# Patient Record
Sex: Female | Born: 1965 | Race: Black or African American | Hispanic: No | Marital: Married | Smoking: Never smoker
Health system: Southern US, Community
[De-identification: ages and names within clinical notes are randomized; demographics above are authoritative.]

## PROBLEM LIST (undated history)

## (undated) DIAGNOSIS — G35D Multiple sclerosis, unspecified: Secondary | ICD-10-CM

## (undated) DIAGNOSIS — G35 Multiple sclerosis: Secondary | ICD-10-CM

---

## 1998-04-15 ENCOUNTER — Other Ambulatory Visit: Admission: RE | Admit: 1998-04-15 | Discharge: 1998-04-15 | Payer: Self-pay | Admitting: Obstetrics and Gynecology

## 1999-05-20 ENCOUNTER — Other Ambulatory Visit: Admission: RE | Admit: 1999-05-20 | Discharge: 1999-05-20 | Payer: Self-pay | Admitting: Obstetrics and Gynecology

## 1999-11-10 ENCOUNTER — Encounter: Admission: RE | Admit: 1999-11-10 | Discharge: 2000-02-08 | Payer: Self-pay | Admitting: Obstetrics & Gynecology

## 1999-12-31 ENCOUNTER — Inpatient Hospital Stay (HOSPITAL_COMMUNITY): Admission: AD | Admit: 1999-12-31 | Discharge: 2000-01-02 | Payer: Self-pay | Admitting: Obstetrics and Gynecology

## 2000-01-03 ENCOUNTER — Encounter: Admission: RE | Admit: 2000-01-03 | Discharge: 2000-04-02 | Payer: Self-pay | Admitting: Obstetrics and Gynecology

## 2000-02-08 ENCOUNTER — Other Ambulatory Visit: Admission: RE | Admit: 2000-02-08 | Discharge: 2000-02-08 | Payer: Self-pay | Admitting: Obstetrics and Gynecology

## 2001-05-19 ENCOUNTER — Emergency Department (HOSPITAL_COMMUNITY): Admission: EM | Admit: 2001-05-19 | Discharge: 2001-05-19 | Payer: Self-pay | Admitting: Emergency Medicine

## 2001-05-19 ENCOUNTER — Encounter: Payer: Self-pay | Admitting: Emergency Medicine

## 2002-12-04 ENCOUNTER — Emergency Department (HOSPITAL_COMMUNITY): Admission: EM | Admit: 2002-12-04 | Discharge: 2002-12-04 | Payer: Self-pay | Admitting: Emergency Medicine

## 2002-12-04 ENCOUNTER — Encounter: Payer: Self-pay | Admitting: Emergency Medicine

## 2005-01-20 ENCOUNTER — Other Ambulatory Visit: Admission: RE | Admit: 2005-01-20 | Discharge: 2005-01-20 | Payer: Self-pay | Admitting: Internal Medicine

## 2005-02-10 ENCOUNTER — Encounter: Admission: RE | Admit: 2005-02-10 | Discharge: 2005-02-10 | Payer: Self-pay | Admitting: Internal Medicine

## 2006-09-26 ENCOUNTER — Inpatient Hospital Stay (HOSPITAL_COMMUNITY): Admission: AD | Admit: 2006-09-26 | Discharge: 2006-09-28 | Payer: Self-pay | Admitting: Obstetrics & Gynecology

## 2007-04-21 ENCOUNTER — Encounter (INDEPENDENT_AMBULATORY_CARE_PROVIDER_SITE_OTHER): Payer: Self-pay | Admitting: Specialist

## 2007-04-21 ENCOUNTER — Inpatient Hospital Stay (HOSPITAL_COMMUNITY): Admission: AD | Admit: 2007-04-21 | Discharge: 2007-04-24 | Payer: Self-pay | Admitting: Obstetrics & Gynecology

## 2009-07-14 ENCOUNTER — Ambulatory Visit: Payer: Self-pay | Admitting: Internal Medicine

## 2009-08-14 ENCOUNTER — Ambulatory Visit: Payer: Self-pay | Admitting: Internal Medicine

## 2009-08-14 ENCOUNTER — Encounter: Payer: Self-pay | Admitting: Family Medicine

## 2009-08-14 LAB — CONVERTED CEMR LAB
ALT: 13 units/L (ref 0–35)
AST: 14 units/L (ref 0–37)
Albumin: 4.2 g/dL (ref 3.5–5.2)
Alkaline Phosphatase: 62 units/L (ref 39–117)
BUN: 11 mg/dL (ref 6–23)
Basophils Absolute: 0 10*3/uL (ref 0.0–0.1)
Basophils Relative: 1 % (ref 0–1)
CO2: 25 meq/L (ref 19–32)
Calcium: 9.1 mg/dL (ref 8.4–10.5)
Chloride: 105 meq/L (ref 96–112)
Cholesterol: 177 mg/dL (ref 0–200)
Creatinine, Ser: 0.72 mg/dL (ref 0.40–1.20)
Eosinophils Absolute: 0.2 10*3/uL (ref 0.0–0.7)
Eosinophils Relative: 3 % (ref 0–5)
Glucose, Bld: 90 mg/dL (ref 70–99)
HCT: 36.4 % (ref 36.0–46.0)
HDL: 46 mg/dL (ref >39)
Hemoglobin: 11.4 g/dL — ABNORMAL LOW (ref 12.0–15.0)
LDL Cholesterol: 113 mg/dL — ABNORMAL HIGH (ref 0–99)
Lymphocytes Relative: 31 % (ref 12–46)
Lymphs Abs: 1.5 10*3/uL (ref 0.7–4.0)
MCHC: 31.3 g/dL (ref 30.0–36.0)
MCV: 90.8 fL (ref 78.0–100.0)
Monocytes Absolute: 0.5 10*3/uL (ref 0.1–1.0)
Monocytes Relative: 9 % (ref 3–12)
Neutro Abs: 2.8 10*3/uL (ref 1.7–7.7)
Neutrophils Relative %: 56 % (ref 43–77)
Platelets: 332 10*3/uL (ref 150–400)
Potassium: 4.1 meq/L (ref 3.5–5.3)
RBC: 4.01 M/uL (ref 3.87–5.11)
RDW: 13.6 % (ref 11.5–15.5)
Sodium: 141 meq/L (ref 135–145)
TSH: 1.244 microintl units/mL (ref 0.350–4.500)
Total Bilirubin: 0.4 mg/dL (ref 0.3–1.2)
Total CHOL/HDL Ratio: 3.8
Total Protein: 7 g/dL (ref 6.0–8.3)
Triglycerides: 91 mg/dL (ref <150)
VLDL: 18 mg/dL (ref 0–40)
WBC: 4.9 10*3/uL (ref 4.0–10.5)

## 2009-10-02 ENCOUNTER — Emergency Department (HOSPITAL_COMMUNITY): Admission: EM | Admit: 2009-10-02 | Discharge: 2009-10-02 | Payer: Self-pay | Admitting: Emergency Medicine

## 2009-10-08 ENCOUNTER — Ambulatory Visit: Payer: Self-pay | Admitting: Internal Medicine

## 2009-10-08 ENCOUNTER — Encounter: Payer: Self-pay | Admitting: Family Medicine

## 2009-10-08 LAB — CONVERTED CEMR LAB
Chlamydia, DNA Probe: NEGATIVE
GC Probe Amp, Genital: NEGATIVE

## 2009-10-15 ENCOUNTER — Ambulatory Visit (HOSPITAL_COMMUNITY): Admission: RE | Admit: 2009-10-15 | Discharge: 2009-10-15 | Payer: Self-pay | Admitting: Internal Medicine

## 2011-04-20 ENCOUNTER — Inpatient Hospital Stay (HOSPITAL_COMMUNITY)
Admission: EM | Admit: 2011-04-20 | Discharge: 2011-04-23 | DRG: 066 | Disposition: A | Discharge status: Home / Self Care | Payer: Medicaid Other | Attending: Internal Medicine | Admitting: Internal Medicine

## 2011-04-20 ENCOUNTER — Emergency Department (HOSPITAL_COMMUNITY): Payer: Medicaid Other

## 2011-04-20 DIAGNOSIS — E669 Obesity, unspecified: Secondary | ICD-10-CM | POA: Diagnosis present

## 2011-04-20 DIAGNOSIS — R5381 Other malaise: Secondary | ICD-10-CM | POA: Diagnosis present

## 2011-04-20 DIAGNOSIS — I6350 Cerebral infarction due to unspecified occlusion or stenosis of unspecified cerebral artery: Principal | ICD-10-CM | POA: Diagnosis present

## 2011-04-20 DIAGNOSIS — E7800 Pure hypercholesterolemia, unspecified: Secondary | ICD-10-CM | POA: Diagnosis present

## 2011-04-20 DIAGNOSIS — H538 Other visual disturbances: Secondary | ICD-10-CM | POA: Diagnosis present

## 2011-04-20 LAB — URINALYSIS, ROUTINE W REFLEX MICROSCOPIC
Bilirubin Urine: NEGATIVE
Glucose, UA: NEGATIVE mg/dL
Hgb urine dipstick: NEGATIVE
Ketones, ur: NEGATIVE mg/dL
Nitrite: NEGATIVE
Protein, ur: NEGATIVE mg/dL
Specific Gravity, Urine: 1.023 (ref 1.005–1.030)
Urobilinogen, UA: 1 mg/dL (ref 0.0–1.0)
pH: 6.5 (ref 5.0–8.0)

## 2011-04-20 LAB — LIPID PANEL
Cholesterol: 189 mg/dL (ref 0–200)
HDL: 45 mg/dL (ref >39)
LDL Cholesterol: 129 mg/dL — ABNORMAL HIGH (ref 0–99)
Total CHOL/HDL Ratio: 4.2 RATIO
Triglycerides: 74 mg/dL (ref <150)
VLDL: 15 mg/dL (ref 0–40)

## 2011-04-20 LAB — COMPREHENSIVE METABOLIC PANEL
ALT: 16 U/L (ref 0–35)
AST: 20 U/L (ref 0–37)
Albumin: 3.7 g/dL (ref 3.5–5.2)
Alkaline Phosphatase: 54 U/L (ref 39–117)
BUN: 9 mg/dL (ref 6–23)
CO2: 26 mEq/L (ref 19–32)
Calcium: 8.9 mg/dL (ref 8.4–10.5)
Chloride: 106 mEq/L (ref 96–112)
Creatinine, Ser: 0.7 mg/dL (ref 0.4–1.2)
GFR calc Af Amer: >60 mL/min (ref >60)
GFR calc non Af Amer: >60 mL/min (ref >60)
Glucose, Bld: 97 mg/dL (ref 70–99)
Potassium: 3.8 mEq/L (ref 3.5–5.1)
Sodium: 135 mEq/L (ref 135–145)
Total Bilirubin: 0.4 mg/dL (ref 0.3–1.2)
Total Protein: 6.9 g/dL (ref 6.0–8.3)

## 2011-04-20 LAB — DIFFERENTIAL
Basophils Absolute: 0 10*3/uL (ref 0.0–0.1)
Basophils Absolute: 0 10*3/uL (ref 0.0–0.1)
Basophils Relative: 0 % (ref 0–1)
Basophils Relative: 1 % (ref 0–1)
Eosinophils Absolute: 0.2 10*3/uL (ref 0.0–0.7)
Eosinophils Absolute: 0.2 10*3/uL (ref 0.0–0.7)
Eosinophils Relative: 3 % (ref 0–5)
Eosinophils Relative: 3 % (ref 0–5)
Lymphocytes Relative: 41 % (ref 12–46)
Lymphocytes Relative: 40 % (ref 12–46)
Lymphs Abs: 3 10*3/uL (ref 0.7–4.0)
Lymphs Abs: 2.7 10*3/uL (ref 0.7–4.0)
Monocytes Absolute: 0.5 10*3/uL (ref 0.1–1.0)
Monocytes Absolute: 0.5 10*3/uL (ref 0.1–1.0)
Monocytes Relative: 7 % (ref 3–12)
Monocytes Relative: 7 % (ref 3–12)
Neutro Abs: 3.6 10*3/uL (ref 1.7–7.7)
Neutro Abs: 3.5 10*3/uL (ref 1.7–7.7)
Neutrophils Relative %: 49 % (ref 43–77)
Neutrophils Relative %: 50 % (ref 43–77)

## 2011-04-20 LAB — BASIC METABOLIC PANEL
BUN: 9 mg/dL (ref 6–23)
CO2: 26 mEq/L (ref 19–32)
Calcium: 9.4 mg/dL (ref 8.4–10.5)
Chloride: 105 mEq/L (ref 96–112)
Creatinine, Ser: 0.69 mg/dL (ref 0.4–1.2)
GFR calc Af Amer: >60 mL/min (ref >60)
GFR calc non Af Amer: >60 mL/min (ref >60)
Glucose, Bld: 98 mg/dL (ref 70–99)
Potassium: 3.7 mEq/L (ref 3.5–5.1)
Sodium: 136 mEq/L (ref 135–145)

## 2011-04-20 LAB — CBC
HCT: 36.8 % (ref 36.0–46.0)
HCT: 36.4 % (ref 36.0–46.0)
Hemoglobin: 12.3 g/dL (ref 12.0–15.0)
Hemoglobin: 12.2 g/dL (ref 12.0–15.0)
MCH: 28.7 pg (ref 26.0–34.0)
MCH: 28.8 pg (ref 26.0–34.0)
MCHC: 33.4 g/dL (ref 30.0–36.0)
MCHC: 33.5 g/dL (ref 30.0–36.0)
MCV: 86 fL (ref 78.0–100.0)
MCV: 86.1 fL (ref 78.0–100.0)
Platelets: 338 10*3/uL (ref 150–400)
Platelets: 323 10*3/uL (ref 150–400)
RBC: 4.28 MIL/uL (ref 3.87–5.11)
RBC: 4.23 MIL/uL (ref 3.87–5.11)
RDW: 12.5 % (ref 11.5–15.5)
RDW: 12.5 % (ref 11.5–15.5)
WBC: 7.3 10*3/uL (ref 4.0–10.5)
WBC: 6.9 10*3/uL (ref 4.0–10.5)

## 2011-04-20 LAB — POCT CARDIAC MARKERS
CKMB, poc: <1 ng/mL — ABNORMAL LOW (ref 1.0–8.0)
Myoglobin, poc: 58.8 ng/mL (ref 12–200)
Troponin i, poc: <0.05 ng/mL (ref 0.00–0.09)

## 2011-04-20 LAB — POCT PREGNANCY, URINE: Preg Test, Ur: NEGATIVE

## 2011-04-20 LAB — PROTIME-INR
INR: 0.96 (ref 0.00–1.49)
Prothrombin Time: 13 seconds (ref 11.6–15.2)

## 2011-04-20 LAB — CK TOTAL AND CKMB (NOT AT ARMC)
CK, MB: 1.1 ng/mL (ref 0.3–4.0)
Relative Index: 0.6 (ref 0.0–2.5)
Total CK: 179 U/L — ABNORMAL HIGH (ref 7–177)

## 2011-04-20 LAB — TROPONIN I: Troponin I: 0.01 ng/mL (ref 0.00–0.06)

## 2011-04-20 LAB — APTT: aPTT: 31 seconds (ref 24–37)

## 2011-04-21 ENCOUNTER — Inpatient Hospital Stay (HOSPITAL_COMMUNITY): Payer: Medicaid Other

## 2011-04-21 DIAGNOSIS — I6789 Other cerebrovascular disease: Secondary | ICD-10-CM

## 2011-04-21 DIAGNOSIS — I6350 Cerebral infarction due to unspecified occlusion or stenosis of unspecified cerebral artery: Secondary | ICD-10-CM

## 2011-04-21 LAB — APTT: aPTT: 32 seconds (ref 24–37)

## 2011-04-21 LAB — GLUCOSE, CAPILLARY
Glucose-Capillary: 95 mg/dL (ref 70–99)
Glucose-Capillary: 95 mg/dL (ref 70–99)
Glucose-Capillary: 130 mg/dL — ABNORMAL HIGH (ref 70–99)
Glucose-Capillary: 135 mg/dL — ABNORMAL HIGH (ref 70–99)

## 2011-04-21 LAB — HOMOCYSTEINE: Homocysteine: 8.2 umol/L (ref 4.0–15.4)

## 2011-04-21 LAB — SEDIMENTATION RATE: Sed Rate: 26 mm/hr — ABNORMAL HIGH (ref 0–22)

## 2011-04-21 LAB — PROTIME-INR
INR: 0.98 (ref 0.00–1.49)
Prothrombin Time: 13.2 seconds (ref 11.6–15.2)

## 2011-04-21 LAB — LIPID PANEL
Cholesterol: 176 mg/dL (ref 0–200)
HDL: 44 mg/dL (ref >39)
LDL Cholesterol: 115 mg/dL — ABNORMAL HIGH (ref 0–99)
Total CHOL/HDL Ratio: 4 RATIO
Triglycerides: 87 mg/dL (ref <150)
VLDL: 17 mg/dL (ref 0–40)

## 2011-04-21 LAB — C-REACTIVE PROTEIN: CRP: 0.8 mg/dL — ABNORMAL HIGH (ref <0.6)

## 2011-04-21 LAB — VITAMIN B12: Vitamin B-12: 943 pg/mL — ABNORMAL HIGH (ref 211–911)

## 2011-04-21 LAB — HEMOGLOBIN A1C
Hgb A1c MFr Bld: 5.9 % — ABNORMAL HIGH (ref <5.7)
Mean Plasma Glucose: 123 mg/dL — ABNORMAL HIGH (ref <117)

## 2011-04-21 LAB — TSH: TSH: 0.684 u[IU]/mL (ref 0.350–4.500)

## 2011-04-22 ENCOUNTER — Inpatient Hospital Stay (HOSPITAL_COMMUNITY): Payer: Medicaid Other

## 2011-04-22 LAB — LUPUS ANTICOAGULANT PANEL
DRVVT: 40.9 secs (ref 36.2–44.3)
Lupus Anticoagulant: NOT DETECTED
PTT Lupus Anticoagulant: 39.9 secs (ref 30.0–45.6)

## 2011-04-22 LAB — CBC
HCT: 33.9 % — ABNORMAL LOW (ref 36.0–46.0)
Hemoglobin: 11.1 g/dL — ABNORMAL LOW (ref 12.0–15.0)
MCH: 28.4 pg (ref 26.0–34.0)
MCHC: 32.7 g/dL (ref 30.0–36.0)
MCV: 86.7 fL (ref 78.0–100.0)
Platelets: 297 10*3/uL (ref 150–400)
RBC: 3.91 MIL/uL (ref 3.87–5.11)
RDW: 12.6 % (ref 11.5–15.5)
WBC: 6.2 10*3/uL (ref 4.0–10.5)

## 2011-04-22 LAB — BASIC METABOLIC PANEL
BUN: 7 mg/dL (ref 6–23)
CO2: 27 mEq/L (ref 19–32)
Calcium: 8.7 mg/dL (ref 8.4–10.5)
Chloride: 105 mEq/L (ref 96–112)
Creatinine, Ser: 0.85 mg/dL (ref 0.4–1.2)
GFR calc Af Amer: >60 mL/min (ref >60)
GFR calc non Af Amer: >60 mL/min (ref >60)
Glucose, Bld: 95 mg/dL (ref 70–99)
Potassium: 3.7 mEq/L (ref 3.5–5.1)
Sodium: 139 mEq/L (ref 135–145)

## 2011-04-22 LAB — BETA-2-GLYCOPROTEIN I ABS, IGG/M/A
Beta-2 Glyco I IgG: 0 G Units (ref <20)
Beta-2-Glycoprotein I IgA: 8 A Units (ref <20)
Beta-2-Glycoprotein I IgM: 12 M Units (ref <20)

## 2011-04-22 LAB — GLUCOSE, CAPILLARY
Glucose-Capillary: 99 mg/dL (ref 70–99)
Glucose-Capillary: 99 mg/dL (ref 70–99)
Glucose-Capillary: 106 mg/dL — ABNORMAL HIGH (ref 70–99)
Glucose-Capillary: 133 mg/dL — ABNORMAL HIGH (ref 70–99)

## 2011-04-22 LAB — ACETYLCHOLINE RECEPTOR, BINDING: Acetylcholine Receptor Ab: <0.3 nmol/L (ref <=0.30)

## 2011-04-22 LAB — PROTEIN S ACTIVITY: Protein S Activity: 106 % (ref 69–129)

## 2011-04-22 LAB — CARDIOLIPIN ANTIBODIES, IGG, IGM, IGA
Anticardiolipin IgA: 15 APL U/mL (ref <22)
Anticardiolipin IgG: 5 GPL U/mL — ABNORMAL LOW (ref <23)
Anticardiolipin IgM: 3 MPL U/mL — ABNORMAL LOW (ref <11)

## 2011-04-22 LAB — PROTEIN C ACTIVITY: Protein C Activity: 114 % (ref 75–133)

## 2011-04-22 LAB — SICKLE CELL SCREEN: Sickle Cell Screen: NEGATIVE

## 2011-04-22 LAB — PROTEIN S, TOTAL: Protein S Ag, Total: 97 % (ref 60–150)

## 2011-04-22 LAB — ANTITHROMBIN III: AntiThromb III Func: 89 % (ref 76–126)

## 2011-04-22 LAB — ANA: Anti Nuclear Antibody(ANA): NEGATIVE

## 2011-04-22 LAB — PROTEIN C, TOTAL: Protein C, Total: 68 % — ABNORMAL LOW (ref 72–160)

## 2011-04-22 LAB — FOLATE RBC: RBC Folate: 785 ng/mL — ABNORMAL HIGH (ref >=366)

## 2011-04-22 MED ORDER — IOHEXOL 300 MG/ML  SOLN
50.0000 mL | Freq: Once | INTRAMUSCULAR | Status: AC | PRN
  Administered 2011-04-22: 50 mL via INTRAVENOUS

## 2011-04-23 LAB — GLUCOSE, CAPILLARY
Glucose-Capillary: 90 mg/dL (ref 70–99)
Glucose-Capillary: 129 mg/dL — ABNORMAL HIGH (ref 70–99)

## 2011-04-24 NOTE — Consult Note (Signed)
[name], [name] NO.:  [id number]  MEDICAL RECORD NO.:  [id number]           PATIENT TYPE:  I  LOCATION:  3733                         FACILITY:  [facility]  PHYSICIAN:  Thana Farr, MD    DATE OF BIRTH:  1966/07/09  DATE OF CONSULTATION:  04/21/2011 DATE OF DISCHARGE:                                CONSULTATION   Time is 11 a.m.  REASON FOR CONSULTATION:  Generalized weakness in the findings of a subacute left lacunar infarct on CT scan.  HISTORY OF PRESENT ILLNESS:  This is a 45 year old African American female with no past medical history other than childbirth x4.  The patient states that she woke on Wednesday morning approximately 5 days ago with generalized weakness.  As she got up to walk, she noted that she had difficulty with her balance and describes a general imbalance. She states that she then lifted to the left then to right, but just had to use a wide-based gait to get around.  She felt as though her lower extremities were slightly weak, but was able to ambulate without assistance.  The patient continued to have generalized weakness throughout on Thursday and Friday.  On Saturday, she noted some blurred vision more centrally.  This continued on Sunday and on Monday, the patient's family decided to bring her to the hospital for further evaluation.  On evaluation, CT of head showed a subacute infarct in the posterior left internal capsule.  At this time, the age was indeterminate.  For that reason, Neurology was consulted for further evaluation.  At the present time, the patient states that she continues to feel generalized weakness, continues to have blurred vision centrally, but no other localizing or lateralizing symptoms.  She denies any dysarthria, dysphasia.  She denies any numbness, tingling in lower or upper extremities.  Denies any dysmetria or difficulty with grip.  The patient denies any bowel or bladder dysfunction.  PAST  MEDICAL HISTORY:  Negative other than childbirth.  MEDICATIONS:  The patient takes no medications at this time.  ALLERGIES:  She is allergic to ERYTHROMYCIN.  FAMILY HISTORY:  Crohn's, hypertension, and possible sickle cell trait.  SOCIAL HISTORY:  The patient lives with her husband.  She is married. She has 4 children.  She does not drink, smoke, or do illicit drugs. She is a housewife.  REVIEW OF SYSTEMS:  Negative with the exception of above.  PHYSICAL EXAMINATION:  VITAL SIGNS:  Blood pressure is 115/68, pulse 77, respirations 16, temperature 98.5. NEUROLOGIC:  The patient is alert and oriented.  She carries out two and three-step commands without any difficulty.  Pupils are equal, round, and reactive to light and accommodating.  Conjugate gaze.  Extraocular movements are intact.  Visual fields are intact.  Double simultaneous stimuli; however, she does state that she has some difficulty with her central vision when looking across the room.  Right eye acutely exophthalmic.  Face appears symmetrical.  Tongue was midline.  Uvula is midline.  I did not note any dysarthria, aphasia, or slurred speech. Facial sensation was grossly intact.  Shoulder shrug and head  turn was within normal limits. COORDINATION:  The patient's coordination with finger-to-nose and heel- to-shin were smooth.  Fine motor movements were smooth, but slow. MOTOR:  The patient showed 5/5 strength throughout.  Tone and bulk were within normal limits.  No asterixis or clonus was noted.  Deep tendon reflexes were 2+ throughout with downgoing toes. DRIFT:  The patient had no drift in the upper or lower extremities. SENSATION:  The patient has full sensation in the upper and lower extremities to pinprick and light touch. PULMONARY:  Clear to auscultation. CARDIOVASCULAR:  S1 and S2 audible. NECK:  Negative for bruits and supple.  LABORATORY DATA:  Urinalysis was negative.  Urine pregnancy test negative.  Sodium  136, potassium 3.7, chloride 105, CO2 of 26, glucose 98, BUN 9, creatinine 0.69.  White blood cell count was 7.3, hemoglobin 12.3, hematocrit 36.8, platelets were 338, INR 0.96.  Cholesterol 189, triglycerides 74, HDL 45, LDL 129.  HbA1c is pending.  Hypercoagulable panel is pending.  IMAGING:  CT of head as above.  MRI is pending.  2-D echo is pending. Carotid Dopplers are pending.  ASSESSMENT/PLAN:  This is a 45 year old female with 5-day history of generalized weakness, 2-day history of blurring of central vision.  CT scan did show age-indeterminate left lacunar infarct; however, this appears to be old and most likely is not the reason for the patient's generalized bilateral weakness.  At this time, we would agree with obtaining MRI to further evaluate intracranial vessels and brain, however, in addition, we will also add TSH, B12, folate, acetylcholine receptor antibody, CRP, sed rate, ANA, and sickle cell screen for further evaluation of generalized weakness.  We will continue to follow this patient while she is in the hospital.  Dr. Thad Ranger has seen and evaluated the patient and agrees with the above-mentioned.  We will continue to follow this patient.     [name] Morn, PA-C   ______________________________ Thana Farr, MD    DS/MEDQ  D:  04/21/2011  T:  04/22/2011  Job:  [id number]  Electronically Signed by [name] Morn PA-C on 04/23/2011 12:36:00 PM Electronically Signed by Thana Farr MD on 04/24/2011 02:54:46 PM

## 2011-04-26 LAB — PROTHROMBIN GENE MUTATION

## 2011-04-26 LAB — FACTOR 5 LEIDEN

## 2011-05-03 ENCOUNTER — Ambulatory Visit: Payer: BC Managed Care – PPO | Admitting: Occupational Therapy

## 2011-05-03 ENCOUNTER — Ambulatory Visit: Payer: BC Managed Care – PPO | Attending: Internal Medicine | Admitting: *Deleted

## 2011-05-03 ENCOUNTER — Encounter: Payer: 59 | Admitting: Occupational Therapy

## 2011-05-03 DIAGNOSIS — I69919 Unspecified symptoms and signs involving cognitive functions following unspecified cerebrovascular disease: Secondary | ICD-10-CM | POA: Insufficient documentation

## 2011-05-03 DIAGNOSIS — M6281 Muscle weakness (generalized): Secondary | ICD-10-CM | POA: Insufficient documentation

## 2011-05-03 DIAGNOSIS — I69998 Other sequelae following unspecified cerebrovascular disease: Secondary | ICD-10-CM | POA: Insufficient documentation

## 2011-05-03 DIAGNOSIS — Z5189 Encounter for other specified aftercare: Secondary | ICD-10-CM | POA: Insufficient documentation

## 2011-05-03 DIAGNOSIS — R279 Unspecified lack of coordination: Secondary | ICD-10-CM | POA: Insufficient documentation

## 2011-05-03 DIAGNOSIS — R269 Unspecified abnormalities of gait and mobility: Secondary | ICD-10-CM | POA: Insufficient documentation

## 2011-05-09 NOTE — Discharge Summary (Signed)
[name], [name] NO.:  [id number]  MEDICAL RECORD NO.:  [id number]           PATIENT TYPE:  I  LOCATION:  3733                         FACILITY:  [facility]  PHYSICIAN:  Osvaldo Shipper, MD     DATE OF BIRTH:  March 09, 1966  DATE OF ADMISSION:  04/20/2011 DATE OF DISCHARGE:  04/23/2011                              DISCHARGE SUMMARY   The patient does not have a PCP yet but she is planning to seek care at [facility] from Apr 27, 2011, onwards when she gets her insurance.  CONSULTATION DURING THIS ADMISSION:  Thana Farr, MD, with Neurology.  IMAGING STUDIES DONE: 1. CT of the head which showed a suspicious finding in the left     internal capsule. 2. MRI of brain was done which showed a signal abnormality in the left     internal capsule, probably representing a subacute infarct.  A     followup MRI of with and without contrast was recommended in 8     weeks.  No mass effect was seen. 3. MRA of the head was done which was negative. 4. CT angio of the head was also done which showed a stable CT     appearance of the brain with hypodensity at the posterior limb of     internal capsule, corresponding to the lesion seen on the MRI. 5. CT angio of the neck was also done which was negative for any     carotid stenosis.  OTHER STUDIES DONE: 1. The carotid Doppler study which suggested a 60-79% left ICA     stenosis, however, please see CT angio results above. 2. Transcranial Dopplers was also normal. 3. Echocardiogram was done which showed normal EF of 60% with normal     wall motion.  No obvious shunt was noted.  PERTINENT LABORATORY DATA:  CBC was unremarkable.  ESR was mildly elevated at 26.  Coags were okay.  Lupus anticoagulant was not detected. Protein C and S functional levels were normal.  Electrolytes were normal.  HbA1c was 5.9.  Troponin was normal.  LDL was 115.  TSH was 0.68.  B12 was 943.  Folate was 785.  Urine pregnancy was negative.   C- reactive protein was 0.8.  Homocystine was 8.2.  UA was unremarkable. ANA was negative.  Glycoprotein levels were normal.  Cardiolipin antibody levels were normal.  Acetylcysteine antibody level was less than 0.3.  DISCHARGE DIAGNOSES: 1. Subacute stroke, left internal capsule, with right-sided weakness,     improved. 2. Generalized weakness, etiology unclear. 3. Hypercholesterolemia, requiring medications.  BRIEF HOSPITAL COURSE:  Briefly, this is a 45 year old African American female, who is obese, who presented to the hospital, complains of blurred vision, being off balance, and generalized weakness.  She was found to have the abnormalities discussed above and was thought to have a subacute stroke in the left internal capsule.  The patient was seen by Neurology.  She underwent workup as detailed above.  The patient has been seen by PT, OT on a daily basis and she has shown significant improvement in her functional level  so much so that she is able to go home with outpatient rehab.  She was put on aspirin and a statin.  She does not have any other medical problems, otherwise.  Heart-healthy diet recommended for her.  Hypercoagulable workup so far has been unremarkable.  Other tests are still pending, I believe.  Carotid Dopplers suggested left stenosis, however, CT angio did not show any stenosis.  The patient was also seen by [facility] and the patient is not thought to require inpatient rehab at this time.  Reason for the patient's generalized weakness is not clear. Acetylcysteine antibody levels are low, TSH was okay, so this may have something to do with her neurological presentation.  PHYSICAL EXAMINATION:  GENERAL:  On the day of discharge, the patient is feeling better, is able to ambulate more, does not require any assistive devices. VITAL SIGNS:  All stable.  Blood pressure is 114/64. LUNGS:  Clear to auscultation. CARDIOVASCULAR:  S1 and S2 normal,  regular.  No S3, S4, rubs, murmurs, or bruits. ABDOMEN:  Soft. NEUROLOGIC:  She still has subtle weakness in the right leg but much improved from admission.  Overall, the patient is stable for discharge.  DISCHARGE MEDICATIONS: 1. Aspirin 325 mg p.o. daily. 2. Simvastatin 40 mg p.o. daily.  FOLLOWUP: 1. The patient is to call [facility] to seek an appointment with     primary care doctor. 2. The patient has been given phone number for Dr. Pearlean Brownie to call for     appointment within a month to two months.  DIET:  Heart healthy.  PHYSICAL ACTIVITY:  As instructed by PT, OT and she will require outpatient PT, OT which will be arranged.  TOTAL TIME ON THIS DISCHARGE ENCOUNTER:  35 minutes.  Once seen by Neurology, the patient can be discharged home today.    Osvaldo Shipper, MD     GK/MEDQ  D:  04/23/2011  T:  04/23/2011  Job:  [id number]  cc:   Thana Farr, MD Pramod P. Pearlean Brownie, MD [facility]  Electronically Signed by Osvaldo Shipper MD on 05/09/2011 09:53:31 PM

## 2011-05-10 ENCOUNTER — Ambulatory Visit: Payer: BC Managed Care – PPO | Admitting: Physical Therapy

## 2011-05-10 ENCOUNTER — Ambulatory Visit: Payer: BC Managed Care – PPO | Admitting: Occupational Therapy

## 2011-05-12 ENCOUNTER — Ambulatory Visit: Payer: BC Managed Care – PPO | Admitting: Physical Therapy

## 2011-05-13 NOTE — H&P (Signed)
NAMECHERLY, ERNO.:  [id number]  MEDICAL RECORD NO.:  [id number]           PATIENT TYPE:  E  LOCATION:  [facility]                         FACILITY:  [facility]  PHYSICIAN:  Massie Maroon, MD        DATE OF BIRTH:  02/06/1966  DATE OF ADMISSION:  04/20/2011 DATE OF DISCHARGE:                             HISTORY & PHYSICAL   CHIEF COMPLAINT:  Blurred vision, off balance, and tired.  HISTORY OF PRESENT ILLNESS:  A 45 year old female with complaints of blurred vision, feeling off balance and tired starting about 6 days ago. The patient was brought into the ER today because of continued symptoms. The patient denies any headache, chest pain, palpitations, shortness of breath, nausea, vomiting, focal weakness, numbness, tingling..  The patient was brought into the ED for evaluation by her family and found to have a focus of hypoattenuation seen in the posterior limb of the internal capsule worrisome for infarct.  There was no hemorrhage or midline shift.  There is no evidence of hydrocephalus.  The patient had a set of cardiac markers that were negative.  EKG showed normal sinus rhythm at 75-80 without any ischemic changes.  The patient will be admitted for workup of stroke.  PAST MEDICAL HISTORY:  None.  PAST SURGICAL HISTORY:  Tubal ligation.  SOCIAL HISTORY:  The patient does not smoke or drink.  She has been married for 22 years.  She has 4 children.  She is a Futures trader.  FAMILY HISTORY:  She has a great aunt who had a stroke.  Her mother has Crohn disease.  She is not sure about her father's past medical history.  ALLERGIES:  ERYTHROMYCIN.  MEDICATIONS:  None.  REVIEW OF SYSTEMS:  Negative for all 10 organ systems except for pertinent positives stated above.  PHYSICAL EXAMINATION:  VITAL SIGNS:  Temperature 98.3, pulse 76, blood pressure 136/80, pulse ox is 99% on room air. HEENT:  Anicteric. NECK:  No JVD, no bruit. HEART:  Regular rate and rhythm.   S1-S2.  No murmurs, gallops or rubs. LUNGS:  Clear to auscultation bilaterally. ABDOMEN:  Soft, nontender, nondistended.  Positive bowel sounds. EXTREMITIES:  No cyanosis, clubbing or edema. SKIN:  No rashes. LYMPH NODES:  No adenopathy. NEURO:  Nonfocal, cranial nerves II-XII intact.  Reflexes 2+, symmetric, diffuse with downgoing toes bilaterally, motor strength 5/5 in all 4extremities, pinprick intact.  Good finger-to-nose and good heel-to- shin.  LABORATORY DATA:  PTT 31.  PT 13.0.  Troponin-I less than 0.05.  Sodium 136, potassium 3.7, BUN 9, creatinine 0.69.  WBC 7.3, hemoglobin 12.3, platelet count 338,000.  Urinalysis negative.  ASSESSMENT/PLAN: 1. Cerebrovascular accident, left posterior limb of the internal     capsule:  Check MRI and check carotid ultrasound.  Check cardiac 2-     D echocardiogram.  The patient will be placed on aspirin, Lipitor.     Consider Neurology consult in the a.m.  Check a hypercoagulable     panel since the patient is so young. 2. DVT prophylaxis, SCDs.     Massie Maroon, MD  JYK/MEDQ  D:  04/20/2011  T:  04/20/2011  Job:  [id number]  Electronically Signed by Pearson Grippe MD on 05/13/2011 06:52:19 AM

## 2011-05-14 NOTE — Discharge Summary (Signed)
NAMEKRYSLYN, HELBIG NO.:  [id number]   MEDICAL RECORD NO.:  [id number]          PATIENT TYPE:  INP   LOCATION:  9126                          FACILITY[facility]  [facility]   PHYSICIAN:  Zelphia Cairo, MD    DATE OF BIRTH:  1966/01/29   DATE OF ADMISSION:  04/21/2007  DATE OF DISCHARGE:  04/24/2007                               DISCHARGE SUMMARY   ADMITTING DIAGNOSES:  1. Intrauterine pregnancy at 15 weeks estimated gestational age.  2. Induction of labor secondary to amniotic fluid index of 7.7.   DISCHARGE DIAGNOSES:  1. Status post low transverse cesarean section secondary to transverse      lie.  2. Viable female infant.  3. Permanent sterilization.   PROCEDURES:  1. Primary low transverse cesarean section.  2. Modified Pomeroy bilateral tubal ligation.   REASON FOR ADMISSION:  Please see written H&P.   HOSPITAL COURSE:  The patient is 45 year old gravida 4, para 3, that was  admitted to [facility] at 38 weeks estimated  gestational age for induction of labor secondary to low AFI.  The  patient had had a history of spontaneous vaginal delivery x3.  On  admission, vital signs were stable.  She was afebrile.  Fetal heart  tones were reactive.  Contractions were noted to be somewhat infrequent.  Cervical exam revealed cervix 4 cm dilated, 50% effaced, vertex at a -2  station.  Artificial rupture of membranes was performed, which revealed  clear fluid.  Pitocin was started to augment the patient's labor.  Later  in the afternoon, the patient was noted to have stronger contractions.  Vaginal exam revealed cervix 4-5 cm, completely effaced, however unable  to feel vertex.  Ultrasound was performed which revealed that the fetus  was now in the transverse lie with back up.  Decision was made to  proceed with a cesarean delivery.  Due to multiparity, the patient had  also requested bilateral tubal ligation.  The patient was then  transferred to the  operating room, where spinal anesthesia was  administered without difficulty.  A low transverse incision was made,  with delivery of a viable female infant weighing 8 pounds 10 ounces,  Apgars of 9 at one minute and 9 at five minutes.  The patient tolerated  the procedure well and was taken to the recovery room in stable  condition.  On postoperative day #1, the patient was without complaint.  Vital signs were stable.  Abdomen soft.  Abdominal dressing was noted to  be clean, dry, and intact.  Laboratories were pending.  On the following  morning, the patient did complain of some gas pain.  Vital signs were  stable.  Abdomen slightly distended.  However, she did have good bowel  sounds.  Uterus was firm and nontender.  Incision was clean, dry, and  intact.  Laboratory findings revealed hemoglobin of 10.1.  On  postoperative day #3, the patient was feeling better.  She was without  complaint.  Vital signs were stable.  Abdomen was slightly distended,  with good bowel sounds.  Fundus firm  and nontender.  Incision was clean,  dry, and intact.  Staples were removed, and the patient was later  discharged home.   CONDITION ON DISCHARGE:  Stable.   DIET:  Regular as tolerated.   ACTIVITY:  No heavy lifting. No driving x2 weeks.  No vaginal entry.   FOLLOW UP:  Patient to follow up in the office in 1 week for incision  check.  She is to call for temperature greater than 100 degrees,  persistent nausea and vomiting, heavy vaginal bleeding, and/or redness  or drainage from the incisional site.   DISCHARGE MEDICATIONS:  1. Tylox, #30, 1 p.o. q.4-6 h. p.r.n.  2. Motrin 600 mg q.6 h.  3. Prenatal vitamins 1 p.o. daily.  4. Colace 1 p.o. daily p.r.n.      Julio Sicks, N.P.      Zelphia Cairo, MD  Electronically Signed    CC/MEDQ  D:  06/24/2007  T:  06/25/2007  Job:  [id number]

## 2011-05-14 NOTE — Discharge Summary (Signed)
NAMEBREANDA, [name] NO.:  [id number]   MEDICAL RECORD NO.:  [id number]          PATIENT TYPE:  INP   LOCATION:  9306                          FACILITY:  [facility]   PHYSICIAN:  M. Leda Quail, MD  DATE OF BIRTH:  12-27-1966   DATE OF ADMISSION:  09/26/2006  DATE OF DISCHARGE:  09/28/2006                                 DISCHARGE SUMMARY   ADMISSION DIAGNOSIS:  55. 45 year old G4, P3, African American female.  2. Right sided pain.  3. 6 cm ovarian cyst.   DISCHARGE DIAGNOSIS:  67. 45 year old G46, P3, African American female.  2. Right sided pain.  3. 6 cm ovarian cyst.   PROCEDURES:  Pain management and ultrasound.   HOSPITAL COURSE:  Ms. [name] is a 45 year old G4, P3, African American  female who presented to [facility] with pelvic pain.  This had been off and on all  day on October 2 and was constant since about 11 p.m.  She came in without  any vaginal bleeding or spotting, no vaginal discharge.  She did have a  positive pregnancy test with a quantitative HCG of 121,000.  She underwent  an exam that showed a tender right adnexa.  As a concern for ectopic was  present, she was sent for ultrasound which showed an 8 5/7 weeks IUP, fetal  heart rate 171, the pregnancy appeared to be developing normally.  The  patient did have two large right ovarian cysts measuring 5.8 and 5 cm.  There did appear to be good blood flow to the ovaries and renal ultrasound  was, otherwise, negative.  She was given IV and oral pain medication and did  have some pre-menstrual pain but ultimately the decision was made to keep  her overnight for monitoring and to do IV pain management.  The patient was  started on a PCA for pain management and the morning of October 2, the  patient was re-evaluated.  She was feeling much better.  She had actually  used four doses of pain medication.  Repeat ultrasound was done to insure  that there was no ovarian torsion and, again, there was good blood  flow to  the ovarian tissue.  The patient was able to tolerate a regular diet and on  the morning of hospital day two, was feeling better.  She had actually had  some nausea with the Dilaudid PCA which actually improved after stopping it.  Her physical exam was relatively benign and she had a soft abdomen which was  nontender, nondistended.  She never had an acute abdomen on exam.  She was  able to tolerate a regular diet and was voiding without difficulty.  As she  had much improved pain management, the decision was made to allow the  patient to go home.  The patient will be seen at the health department next  week, a contact was made for the patient and the coordinator of new patient  appointments called the patient to schedule an appointment.  The patient has  applied for Medicaid and is aware that once she  has a Medicaid card she is  welcome to transfer and she has been seen by our physicians in the past.  This is where she would like to go in the future and she knows as soon as  she has her Medicaid card she is welcome to transfer her care there.      Lum Keas, MD  Electronically Signed     MSM/MEDQ  D:  10/17/2006  T:  10/18/2006  Job:  [id number]

## 2011-05-14 NOTE — Op Note (Signed)
[name], [name] NO.:  [id number]   MEDICAL RECORD NO.:  [id number]          PATIENT TYPE:  INP   LOCATION:  9126                          FACILITY:  [facility]   PHYSICIAN:  Michelle L. Grewal, M.D.DATE OF BIRTH:  [birth year]   DATE OF PROCEDURE:  04/21/2007  DATE OF DISCHARGE:                               OPERATIVE REPORT   PREOPERATIVE DIAGNOSES:  1. Intrauterine pregnancy at term.  2. Oligohydramnios.  3. Transverse lie.  4. Desires permanent sterilization.   POSTOPERATIVE DIAGNOSES:  1. Intrauterine pregnancy at term.  2. Oligohydramnios.  3. Transverse lie.  4. Desires permanent sterilization.   PROCEDURE:  Primary low transverse cesarean section and modified Pomeroy  bilateral tubal ligation.   SURGEON:  Dr. Vincente Poli.   ANESTHESIA:  Spinal.   FINDINGS:  Female infant, T lie position, shoulder cord noted, Apgars 8 at  one minute and 9 at five minutes and weighing 8 pounds 10 ounces.   ESTIMATED BLOOD LOSS:  500 mL.   PROCEDURE:  Patient was taken to the operating room.  Spinal was placed,  and she was prepped and draped, and a Foley catheter was inserted.  It  was noted the baby was in T lie still by exam of her abdomen.  A low  transverse incision was made, carried down to the fascia, fascia scored  in midline and extended laterally.  Rectus muscles were separated in the  midline.  The peritoneum was entered bluntly.  The peritoneal incision  was then stretched.  The bladder blade was inserted.  The lower uterine  segment was identified, and the bladder flap was created sharply and  then digitally.  The bladder blade was readjusted.  A low transverse  incision was made in the uterus.  The baby was then delivered.  We were  able to bring the head down into the incision and was delivered cephalic  without any difficulty.  There was a lot of cord down near the shoulder,  and actually there was a loop of cord wrapped around the shoulder and  the right  arm.  Apgars were 8 at one minute and 9 at five minutes, and  he weighed 8 pounds 10 ounces.  The cord was clamped and cut.  The baby  was handed to the waiting pediatrician.  The uterus was exteriorized and  cleared of all clots and debris.  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  She may have had  an abruption at some point during her labor.  The uterine incision was  closed in one layer using 0 chromic in a running locked stitch.  Hemostasis was excellent.  At this point, I then identified each  fimbriated end and performed a modified Pomeroy bilateral tubal  ligation, grasping each midportion of each tying off a 3-cm knuckle on  each side x2 with plain gut suture x2 and excising out with Metzenbaum  scissors.  Each fallopian tube segment was sent separately for  pathology.  The uterus was carefully placed back  in the abdomen.  Irrigation was performed.  All pedicles were hemostatic.  Everything was  fine.  The peritoneum was reapproximated using 0 Vicryl.  The rectus  muscles were reapproximated using 0 Vicryl.  The fascia was closed in 0  Vicryl running stitch starting at each corner and meeting in the  midline.  After irrigation of the subcutaneous layer, the skin was  closed with staples.  All sponge, lap and instrument counts were correct  x2.  The patient went to the recovery room in stable condition.      Michelle L. Vincente Poli, M.D.  Electronically Signed     MLG/MEDQ  D:  04/21/2007  T:  04/21/2007  Job:  [id number]

## 2011-05-21 ENCOUNTER — Ambulatory Visit: Payer: BC Managed Care – PPO | Admitting: *Deleted

## 2011-05-21 ENCOUNTER — Ambulatory Visit: Payer: BC Managed Care – PPO | Admitting: Occupational Therapy

## 2011-05-25 ENCOUNTER — Ambulatory Visit: Payer: BC Managed Care – PPO | Admitting: Occupational Therapy

## 2011-05-25 ENCOUNTER — Ambulatory Visit: Payer: BC Managed Care – PPO | Admitting: *Deleted

## 2011-05-26 ENCOUNTER — Ambulatory Visit: Payer: BC Managed Care – PPO | Admitting: Occupational Therapy

## 2011-05-26 ENCOUNTER — Ambulatory Visit: Payer: BC Managed Care – PPO | Admitting: *Deleted

## 2011-05-31 ENCOUNTER — Ambulatory Visit: Payer: 59 | Admitting: *Deleted

## 2011-05-31 ENCOUNTER — Ambulatory Visit: Payer: BC Managed Care – PPO | Attending: Neurology | Admitting: Occupational Therapy

## 2011-05-31 DIAGNOSIS — R279 Unspecified lack of coordination: Secondary | ICD-10-CM | POA: Insufficient documentation

## 2011-05-31 DIAGNOSIS — Z5189 Encounter for other specified aftercare: Secondary | ICD-10-CM | POA: Insufficient documentation

## 2011-05-31 DIAGNOSIS — R269 Unspecified abnormalities of gait and mobility: Secondary | ICD-10-CM | POA: Insufficient documentation

## 2011-05-31 DIAGNOSIS — I69919 Unspecified symptoms and signs involving cognitive functions following unspecified cerebrovascular disease: Secondary | ICD-10-CM | POA: Insufficient documentation

## 2011-05-31 DIAGNOSIS — M6281 Muscle weakness (generalized): Secondary | ICD-10-CM | POA: Insufficient documentation

## 2011-05-31 DIAGNOSIS — I69998 Other sequelae following unspecified cerebrovascular disease: Secondary | ICD-10-CM | POA: Insufficient documentation

## 2011-06-02 ENCOUNTER — Ambulatory Visit: Payer: BC Managed Care – PPO | Admitting: Occupational Therapy

## 2011-06-03 ENCOUNTER — Ambulatory Visit: Payer: BC Managed Care – PPO | Admitting: *Deleted

## 2011-06-07 ENCOUNTER — Ambulatory Visit: Payer: BC Managed Care – PPO | Admitting: *Deleted

## 2011-06-07 ENCOUNTER — Encounter: Payer: 59 | Admitting: Occupational Therapy

## 2011-06-09 ENCOUNTER — Ambulatory Visit: Payer: BC Managed Care – PPO | Admitting: *Deleted

## 2011-06-09 ENCOUNTER — Ambulatory Visit: Payer: BC Managed Care – PPO | Admitting: Occupational Therapy

## 2011-06-14 ENCOUNTER — Encounter: Payer: 59 | Admitting: Occupational Therapy

## 2011-06-14 ENCOUNTER — Ambulatory Visit: Payer: 59 | Admitting: Physical Therapy

## 2011-06-16 ENCOUNTER — Other Ambulatory Visit (HOSPITAL_COMMUNITY)
Admission: RE | Admit: 2011-06-16 | Discharge: 2011-06-16 | Disposition: A | Discharge status: Home / Self Care | Payer: BC Managed Care – PPO | Source: Ambulatory Visit | Attending: Family Medicine | Admitting: Family Medicine

## 2011-06-16 ENCOUNTER — Other Ambulatory Visit: Payer: Self-pay

## 2011-06-16 ENCOUNTER — Ambulatory Visit: Payer: 59 | Admitting: Physical Therapy

## 2011-06-16 ENCOUNTER — Encounter: Payer: 59 | Admitting: Occupational Therapy

## 2011-06-16 DIAGNOSIS — Z01419 Encounter for gynecological examination (general) (routine) without abnormal findings: Secondary | ICD-10-CM | POA: Insufficient documentation

## 2011-06-21 ENCOUNTER — Encounter: Payer: 59 | Admitting: Occupational Therapy

## 2011-06-21 ENCOUNTER — Ambulatory Visit: Payer: 59 | Admitting: *Deleted

## 2011-06-23 ENCOUNTER — Ambulatory Visit: Payer: 59 | Admitting: *Deleted

## 2011-06-23 ENCOUNTER — Encounter: Payer: 59 | Admitting: Occupational Therapy

## 2012-01-04 ENCOUNTER — Other Ambulatory Visit: Payer: Self-pay | Admitting: Family Medicine

## 2012-01-04 DIAGNOSIS — N926 Irregular menstruation, unspecified: Secondary | ICD-10-CM

## 2012-01-05 ENCOUNTER — Other Ambulatory Visit: Payer: BC Managed Care – PPO

## 2012-01-14 ENCOUNTER — Other Ambulatory Visit: Payer: BC Managed Care – PPO

## 2012-01-25 ENCOUNTER — Other Ambulatory Visit: Payer: BC Managed Care – PPO

## 2012-01-26 ENCOUNTER — Ambulatory Visit
Admission: RE | Admit: 2012-01-26 | Discharge: 2012-01-26 | Disposition: A | Discharge status: Home / Self Care | Payer: No Typology Code available for payment source | Source: Ambulatory Visit | Attending: Family Medicine | Admitting: Family Medicine

## 2012-01-26 DIAGNOSIS — N926 Irregular menstruation, unspecified: Secondary | ICD-10-CM

## 2013-06-12 ENCOUNTER — Other Ambulatory Visit: Payer: Self-pay | Admitting: Neurology

## 2013-06-12 DIAGNOSIS — I6990 Unspecified sequelae of unspecified cerebrovascular disease: Secondary | ICD-10-CM

## 2013-06-12 DIAGNOSIS — R269 Unspecified abnormalities of gait and mobility: Secondary | ICD-10-CM

## 2013-06-19 ENCOUNTER — Ambulatory Visit (HOSPITAL_COMMUNITY)
Admission: RE | Admit: 2013-06-19 | Discharge: 2013-06-19 | Disposition: A | Discharge status: Home / Self Care | Payer: BC Managed Care – PPO | Source: Ambulatory Visit | Attending: Neurology | Admitting: Neurology

## 2013-06-19 ENCOUNTER — Other Ambulatory Visit: Payer: Self-pay | Admitting: Neurology

## 2013-06-19 DIAGNOSIS — G9389 Other specified disorders of brain: Secondary | ICD-10-CM | POA: Insufficient documentation

## 2013-06-19 DIAGNOSIS — I6990 Unspecified sequelae of unspecified cerebrovascular disease: Secondary | ICD-10-CM

## 2013-06-19 DIAGNOSIS — R269 Unspecified abnormalities of gait and mobility: Secondary | ICD-10-CM | POA: Insufficient documentation

## 2013-06-25 ENCOUNTER — Ambulatory Visit (HOSPITAL_COMMUNITY)
Admission: RE | Admit: 2013-06-25 | Discharge: 2013-06-25 | Disposition: A | Discharge status: Home / Self Care | Payer: BC Managed Care – PPO | Source: Ambulatory Visit | Attending: Neurology | Admitting: Neurology

## 2013-06-25 ENCOUNTER — Encounter (HOSPITAL_COMMUNITY): Payer: Self-pay

## 2013-06-25 MED ORDER — GADOBENATE DIMEGLUMINE 529 MG/ML IV SOLN
15.0000 mL | Freq: Once | INTRAVENOUS | Status: AC | PRN
  Administered 2013-06-25: 15 mL via INTRAVENOUS

## 2013-08-22 ENCOUNTER — Encounter (HOSPITAL_COMMUNITY)
Admission: RE | Admit: 2013-08-22 | Discharge: 2013-08-22 | Disposition: A | Discharge status: Home / Self Care | Payer: BC Managed Care – PPO | Source: Ambulatory Visit | Attending: Neurology | Admitting: Neurology

## 2013-08-22 DIAGNOSIS — Z79899 Other long term (current) drug therapy: Secondary | ICD-10-CM | POA: Insufficient documentation

## 2013-08-22 DIAGNOSIS — R269 Unspecified abnormalities of gait and mobility: Secondary | ICD-10-CM | POA: Insufficient documentation

## 2013-08-22 DIAGNOSIS — G35 Multiple sclerosis: Secondary | ICD-10-CM | POA: Insufficient documentation

## 2013-08-22 MED ORDER — METHYLPREDNISOLONE SODIUM SUCC 125 MG IJ SOLR: 1000.0000 mg | Freq: Once | INTRAMUSCULAR | Status: DC

## 2013-08-22 MED ORDER — SODIUM CHLORIDE 0.9 % IJ SOLN
10.0000 mL | INTRAMUSCULAR | Status: AC | PRN
  Administered 2013-08-22: 10 mL

## 2013-08-22 MED ORDER — SODIUM CHLORIDE 0.9 % IV SOLN
1000.0000 mg | Freq: Once | INTRAVENOUS | Status: AC
  Administered 2013-08-22: 1000 mg via INTRAVENOUS
  Filled 2013-08-22: qty 8

## 2013-08-23 ENCOUNTER — Encounter (HOSPITAL_COMMUNITY)
Admission: RE | Admit: 2013-08-23 | Discharge: 2013-08-23 | Disposition: A | Discharge status: Home / Self Care | Payer: BC Managed Care – PPO | Source: Ambulatory Visit | Attending: Neurology | Admitting: Neurology

## 2013-08-23 MED ORDER — SODIUM CHLORIDE 0.9 % IJ SOLN: 10.0000 mL | INTRAMUSCULAR | Status: DC | PRN

## 2013-08-23 MED ORDER — SODIUM CHLORIDE 0.9 % IV SOLN
1000.0000 mg | Freq: Every day | INTRAVENOUS | Status: DC
  Administered 2013-08-23: 1000 mg via INTRAVENOUS
  Filled 2013-08-23: qty 8

## 2013-08-23 MED ORDER — SODIUM CHLORIDE 0.9 % IJ SOLN
10.0000 mL | INTRAMUSCULAR | Status: AC | PRN
  Administered 2013-08-23: 10 mL

## 2013-08-24 ENCOUNTER — Encounter (HOSPITAL_COMMUNITY)
Admission: RE | Admit: 2013-08-24 | Discharge: 2013-08-24 | Disposition: A | Discharge status: Home / Self Care | Payer: BC Managed Care – PPO | Source: Ambulatory Visit | Attending: Neurology | Admitting: Neurology

## 2013-08-24 MED ORDER — SODIUM CHLORIDE 0.9 % IJ SOLN: 10.0000 mL | INTRAMUSCULAR | Status: DC | PRN

## 2013-08-24 MED ORDER — SODIUM CHLORIDE 0.9 % IJ SOLN
10.0000 mL | INTRAMUSCULAR | Status: AC | PRN
  Administered 2013-08-24: 10 mL

## 2013-08-24 MED ORDER — SODIUM CHLORIDE 0.9 % IV SOLN
1000.0000 mg | Freq: Once | INTRAVENOUS | Status: AC
  Administered 2013-08-24: 1000 mg via INTRAVENOUS
  Filled 2013-08-24: qty 8

## 2015-04-16 ENCOUNTER — Other Ambulatory Visit: Payer: Self-pay | Admitting: Neurology

## 2015-04-16 DIAGNOSIS — G35 Multiple sclerosis: Secondary | ICD-10-CM

## 2015-04-25 ENCOUNTER — Ambulatory Visit (HOSPITAL_COMMUNITY)
Admission: RE | Admit: 2015-04-25 | Discharge: 2015-04-25 | Disposition: A | Discharge status: Home / Self Care | Payer: BLUE CROSS/BLUE SHIELD | Source: Ambulatory Visit | Attending: Neurology | Admitting: Neurology

## 2015-04-25 DIAGNOSIS — G35 Multiple sclerosis: Secondary | ICD-10-CM | POA: Diagnosis not present

## 2015-04-25 MED ORDER — GADOBENATE DIMEGLUMINE 529 MG/ML IV SOLN
18.0000 mL | Freq: Once | INTRAVENOUS | Status: AC | PRN
  Administered 2015-04-25: 18 mL via INTRAVENOUS

## 2016-01-30 ENCOUNTER — Other Ambulatory Visit: Payer: Self-pay

## 2016-01-30 DIAGNOSIS — Z1231 Encounter for screening mammogram for malignant neoplasm of breast: Secondary | ICD-10-CM

## 2016-02-11 ENCOUNTER — Ambulatory Visit
Admission: RE | Admit: 2016-02-11 | Discharge: 2016-02-11 | Disposition: A | Discharge status: Home / Self Care | Payer: BLUE CROSS/BLUE SHIELD | Source: Ambulatory Visit

## 2016-02-11 DIAGNOSIS — Z1231 Encounter for screening mammogram for malignant neoplasm of breast: Secondary | ICD-10-CM

## 2017-02-02 ENCOUNTER — Encounter (HOSPITAL_COMMUNITY)
Admission: RE | Admit: 2017-02-02 | Discharge: 2017-02-02 | Disposition: A | Discharge status: Home / Self Care | Payer: BLUE CROSS/BLUE SHIELD | Source: Ambulatory Visit | Attending: Neurology | Admitting: Neurology

## 2017-02-02 ENCOUNTER — Encounter (HOSPITAL_COMMUNITY): Payer: Self-pay

## 2017-02-02 DIAGNOSIS — G35 Multiple sclerosis: Secondary | ICD-10-CM | POA: Diagnosis present

## 2017-02-02 MED ORDER — SODIUM CHLORIDE 0.9 % IV SOLN
1000.0000 mg | Freq: Once | INTRAVENOUS | Status: AC
  Administered 2017-02-02: 1000 mg via INTRAVENOUS
  Filled 2017-02-02: qty 8

## 2017-02-02 MED ORDER — SODIUM CHLORIDE 0.9 % IV SOLN
INTRAVENOUS | Status: DC
  Administered 2017-02-02: 09:00:00 via INTRAVENOUS

## 2017-02-03 ENCOUNTER — Encounter (HOSPITAL_COMMUNITY)
Admission: RE | Admit: 2017-02-03 | Discharge: 2017-02-03 | Disposition: A | Discharge status: Home / Self Care | Payer: BLUE CROSS/BLUE SHIELD | Source: Ambulatory Visit | Attending: Neurology | Admitting: Neurology

## 2017-02-03 DIAGNOSIS — G35 Multiple sclerosis: Secondary | ICD-10-CM | POA: Diagnosis not present

## 2017-02-03 MED ORDER — SODIUM CHLORIDE 0.9 % IV SOLN
Freq: Once | INTRAVENOUS | Status: AC
  Administered 2017-02-03: 10:00:00 via INTRAVENOUS

## 2017-02-03 MED ORDER — METHYLPREDNISOLONE SODIUM SUCC 1000 MG IJ SOLR
1000.0000 mg | Freq: Once | INTRAMUSCULAR | Status: AC
  Administered 2017-02-03: 1000 mg via INTRAVENOUS
  Filled 2017-02-03: qty 8

## 2017-02-04 ENCOUNTER — Encounter (HOSPITAL_COMMUNITY)
Admission: RE | Admit: 2017-02-04 | Discharge: 2017-02-04 | Disposition: A | Discharge status: Home / Self Care | Payer: BLUE CROSS/BLUE SHIELD | Source: Ambulatory Visit | Attending: Neurology | Admitting: Neurology

## 2017-02-04 DIAGNOSIS — G35 Multiple sclerosis: Secondary | ICD-10-CM | POA: Diagnosis not present

## 2017-02-04 MED ORDER — SODIUM CHLORIDE 0.9 % IV SOLN
1000.0000 mg | Freq: Once | INTRAVENOUS | Status: AC
  Administered 2017-02-04: 1000 mg via INTRAVENOUS
  Filled 2017-02-04: qty 8

## 2017-02-04 MED ORDER — SODIUM CHLORIDE 0.9 % IV SOLN
INTRAVENOUS | Status: DC
  Administered 2017-02-04: 250 mL via INTRAVENOUS

## 2017-02-28 ENCOUNTER — Emergency Department (HOSPITAL_COMMUNITY)
Admission: EM | Admit: 2017-02-28 | Discharge: 2017-02-28 | Disposition: A | Discharge status: Home / Self Care | Payer: BLUE CROSS/BLUE SHIELD | Attending: Emergency Medicine | Admitting: Emergency Medicine

## 2017-02-28 ENCOUNTER — Encounter (HOSPITAL_COMMUNITY): Payer: Self-pay | Admitting: Nurse Practitioner

## 2017-02-28 DIAGNOSIS — G35 Multiple sclerosis: Secondary | ICD-10-CM | POA: Diagnosis not present

## 2017-02-28 DIAGNOSIS — R200 Anesthesia of skin: Secondary | ICD-10-CM | POA: Diagnosis present

## 2017-02-28 DIAGNOSIS — R202 Paresthesia of skin: Secondary | ICD-10-CM | POA: Insufficient documentation

## 2017-02-28 HISTORY — DX: Multiple sclerosis, unspecified: G35.D

## 2017-02-28 HISTORY — DX: Multiple sclerosis: G35

## 2017-02-28 LAB — URINALYSIS, ROUTINE W REFLEX MICROSCOPIC
Bilirubin Urine: NEGATIVE
Glucose, UA: NEGATIVE mg/dL
Hgb urine dipstick: NEGATIVE
Ketones, ur: NEGATIVE mg/dL
Leukocytes, UA: NEGATIVE
Nitrite: NEGATIVE
Protein, ur: NEGATIVE mg/dL
Specific Gravity, Urine: 1.024 (ref 1.005–1.030)
pH: 5 (ref 5.0–8.0)

## 2017-02-28 LAB — CBC
HCT: 35.3 % — ABNORMAL LOW (ref 36.0–46.0)
Hemoglobin: 11.6 g/dL — ABNORMAL LOW (ref 12.0–15.0)
MCH: 28.4 pg (ref 26.0–34.0)
MCHC: 32.9 g/dL (ref 30.0–36.0)
MCV: 86.5 fL (ref 78.0–100.0)
Platelets: 381 10*3/uL (ref 150–400)
RBC: 4.08 MIL/uL (ref 3.87–5.11)
RDW: 13.3 % (ref 11.5–15.5)
WBC: 5.9 10*3/uL (ref 4.0–10.5)

## 2017-02-28 LAB — BASIC METABOLIC PANEL
Anion gap: 7 (ref 5–15)
BUN: 9 mg/dL (ref 6–20)
CO2: 28 mmol/L (ref 22–32)
Calcium: 9.3 mg/dL (ref 8.9–10.3)
Chloride: 105 mmol/L (ref 101–111)
Creatinine, Ser: 0.88 mg/dL (ref 0.44–1.00)
GFR calc Af Amer: >60 mL/min (ref >60)
GFR calc non Af Amer: >60 mL/min (ref >60)
Glucose, Bld: 108 mg/dL — ABNORMAL HIGH (ref 65–99)
Potassium: 3.8 mmol/L (ref 3.5–5.1)
Sodium: 140 mmol/L (ref 135–145)

## 2017-02-28 NOTE — ED Provider Notes (Signed)
[facility] Provider Note   CSN: [id number] Arrival date & time: 02/28/17  1530     History   Chief Complaint Chief Complaint  Patient presents with  . Numbness    HPI [name] is a 51 y.o. female with past medical history of multiple sclerosis diagnosed in 2014 presents to the emergency department reporting generalized numbness and tingling that first started in bilateral hands and now is across her entire body (posterior head, arms, bilateral legs).  Symptoms started ~2 weeks ago. Patient states that she stopped taking Tecfidera for MS 2-3 months ago because she ran out of her prescription.  She contacted her neurologist (Dr. Beryle Beams) who advised her she needed new lab work up before she could get a refill.  Reportedly patient did lab work but her neurologist has not refilled her prescriptions and he is now out of town and cannot see her in clinic until next week.  Patient states she went to [facility] ED for same symptoms on 02/02/17, she was evaluated and she received three shots of solumedrol for her symptoms (on 2/7, 2/8, 2/9) which improved her symptoms.  Symptoms started to worsen again 10 days ago.  Patient requests referral to new neurologist in the area.  Patient denies fevers, changes in vision, difficulty breathing/SOB, n/v/d/c or heat exposure.     HPI  Past Medical History:  Diagnosis Date  . Multiple sclerosis (HCC)     There are no active problems to display for this patient.   Past Surgical History:  Procedure Laterality Date  . CESAREAN SECTION      OB History    No data available       Home Medications    Prior to Admission medications   Medication Sig Start Date End Date Taking? Authorizing Provider  ibuprofen (ADVIL,MOTRIN) 200 MG tablet Take 400 mg by mouth every 6 (six) hours as needed for headache.   Yes Historical Provider, MD  Dimethyl Fumarate (TECFIDERA) 240 MG CPDR Take 240 mg by mouth 2 (two) times daily.    Historical  Provider, MD  Vitamin D, Ergocalciferol, (DRISDOL) 50000 units CAPS capsule Take 50,000 Units by mouth once a week. 02/25/17   Historical Provider, MD    Family History History reviewed. No pertinent family history.  Social History Social History  Substance Use Topics  . Smoking status: Never Smoker  . Smokeless tobacco: Never Used  . Alcohol use No     Allergies   Erythromycin   Review of Systems Review of Systems  Constitutional: Negative for appetite change, chills and fever.  HENT: Negative for congestion and sore throat.   Eyes: Negative for photophobia, pain and visual disturbance.  Respiratory: Negative for cough, chest tightness and shortness of breath.   Cardiovascular: Negative for chest pain, palpitations and leg swelling.  Gastrointestinal: Negative for abdominal pain, constipation, diarrhea, nausea and vomiting.  Genitourinary: Negative for difficulty urinating, dysuria and hematuria.  Musculoskeletal: Positive for gait problem. Negative for arthralgias.  Skin: Negative for rash.  Allergic/Immunologic: Positive for immunocompromised state (multiple sclerosis).  Neurological: Positive for numbness. Negative for dizziness, seizures, syncope, speech difficulty, weakness, light-headedness and headaches.  Hematological: Does not bruise/bleed easily.  Psychiatric/Behavioral: Negative.      Physical Exam Updated Vital Signs BP 111/71 (BP Location: Right Arm)   Pulse 84   Temp 98.8 F (37.1 C) (Oral)   Resp 19   SpO2 99%   Physical Exam  Constitutional: She is oriented to person, place, and  time. She appears well-developed and well-nourished.  HENT:  Head: Normocephalic and atraumatic.  Nose: Nose normal.  Mouth/Throat: Oropharynx is clear and moist. No oropharyngeal exudate.  Eyes: Conjunctivae and EOM are normal. Pupils are equal, round, and reactive to light.  Neck: Normal range of motion. Neck supple.  Cardiovascular: Normal rate, regular rhythm, normal  heart sounds and intact distal pulses.   No murmur heard. Pulmonary/Chest: Effort normal and breath sounds normal. No respiratory distress. She has no wheezes. She has no rales. She exhibits no tenderness.  Abdominal: Soft. Bowel sounds are normal. She exhibits no distension. There is no tenderness.  Musculoskeletal: Normal range of motion. She exhibits no deformity.  Lymphadenopathy:    She has no cervical adenopathy.  Neurological: She is alert and oriented to person, place, and time. No sensory deficit.  Pt is alert and oriented.   Speech and phonation normal.   Thought process coherent.   Strength 5/5 in upper and lower extremities.   Sensation to light touch intact in upper and lower extremities.  Gait normal.   Negative Romberg. No leg drift.  Intact finger to nose test. CN I not tested CN II full visual fields  CN III, IV, VI PEERL and EOM intact CN V light touch intact in all 3 divisions of trigeminal nerve CN VII facial nerve movements intact, symmetric CN VIII hearing intact to finger rub CN IX, X no uvula deviation, symmetric soft palate rise CN XI 5/5 SCM and trapezius strength  CN XII Tongue midline with symmetric L/R movement  Skin: Skin is warm and dry. Capillary refill takes less than 2 seconds.  Psychiatric: She has a normal mood and affect. Her behavior is normal. Judgment and thought content normal.  Nursing note and vitals reviewed.    ED Treatments / Results  Labs (all labs ordered are listed, but only abnormal results are displayed) Labs Reviewed  BASIC METABOLIC PANEL - Abnormal; Notable for the following:       Result Value   Glucose, Bld 108 (*)    All other components within normal limits  CBC - Abnormal; Notable for the following:    Hemoglobin 11.6 (*)    HCT 35.3 (*)    All other components within normal limits  URINALYSIS, ROUTINE W REFLEX MICROSCOPIC    EKG  EKG Interpretation  Date/Time:  Monday February 28 2017 15:48:44 EST Ventricular  Rate:  83 PR Interval:  160 QRS Duration: 68 QT Interval:  360 QTC Calculation: 423 R Axis:   25 Text Interpretation:  Normal sinus rhythm Normal ECG No significant change since last tracing Confirmed by LITTLE MD, RACHEL ([id number]) on 02/28/2017 7:42:57 PM       Radiology No results found.  Procedures Procedures (including critical care time)  Medications Ordered in ED Medications - No data to display   Initial Impression / Assessment and Plan / ED Course  I have reviewed the triage vital signs and the nursing notes.  Pertinent labs & imaging results that were available during my care of the patient were reviewed by me and considered in my medical decision making (see chart for details).  Clinical Course as of Feb 28 2257  Mon Feb 28, 2017  2231 Nitrite: NEGATIVE [CG]  2231 Temp: 98.8 F (37.1 C) [CG]  2231 Pulse Rate: 84 [CG]  2231 BP: 111/71 [CG]  2231 Resp: 19 [CG]  2231 SpO2: 99 % [CG]  2231 Sodium: 140 [CG]  2231 Potassium: 3.8 [CG]  2231 Chloride: 105 [  CG]  2231 CO2: 28 [CG]  2231 Glucose: (!) 108 [CG]  2231 WBC: 5.9 [CG]  2231 Normal sinus rhythm Normal ECG ED EKG [CG]    Clinical Course User Index [CG] Liberty Handy, PA-C   51 year old female with multiple sclerosis diagnosed in 2014 (followed by Dr. Beryle Beams) presents with generalized numbness and tingling x 1 month. Patient stopped taking her Tecfidera 2-3 months ago, has contacted her neurologist who has done blood work but has not refilled her prescription.  Patient requesting referral to new neurologist.  Patient went to [facility] ED for same on 2/7-2/9 and she was given Solu-Medrol injections 3 which helped her numbness and tingling. Patient states that her numbness and tingling improved with the steroids but have since returned and worsened in the last 10 days. No fever, shortness of breath, chest pain, changes in vision. Vital signs reassuring without fever. Complete neuro exam normal. No  electrolyte abnormalities or signs of infection and urinalysis.  I do not think further emergent lab work or imaging is indicated at this time. Patient will follow benefit from outpatient neurology follow-up to restart her Tecfidera.  Might consider repeat MRI to look for new lesions or progression.  Ambulatory referral to neurology place to be seen in clinic in 1 week.  Discussed plan with patient and relative who are in agreement with outpatient follow up.  Strict ED return precautions patient is aware of red flag symptoms to monitor for that would warrant return to ED for more work up (chest pain, shortness of breath/difficulty breathing, weakness, changes in vision).  Patient, ED treatment and discharge plan was discussed with supervising physician who is agreeable with plan.     Final Clinical Impressions(s) / ED Diagnoses   Final diagnoses:  Paresthesias  Multiple sclerosis [facility])    New Prescriptions New Prescriptions   No medications on file     Jerrell Mylar 02/28/17 2258    Laurence Spates, MD 03/01/17 [phone]

## 2017-02-28 NOTE — ED Triage Notes (Signed)
Pt presents with c/o numbness and tingling. She has a history of numbness and tingling to her hands since 2014 which she relates to her diagnosis of MS. Since 10 days ago, the numbness and tingling has moved into her entire body. She denies any pain now, but she has had some pain in her back and sides when lying down over the past several days. She also noticed more headaches than normal this week and she's felt more difficulty walking. She has tried stretching with no improvement. She has seen neurologist for this and was told to "wait it out" but she feels the symptoms are getting worse.

## 2017-02-28 NOTE — Discharge Instructions (Signed)
The blood work we did in the emergency department is overall normal. Your EKG and urinalysis were also normal. Please follow-up with neurologist to be seen as soon as possible for an evaluation.   We have placed an urgent referral to neurology and we have asked them to see you in clinic in 1 week.    Return to the emergency department if you develop weakness, shortness of breath or difficulty breathing, blurred vision or changes in your vision

## 2017-03-08 ENCOUNTER — Other Ambulatory Visit: Payer: Self-pay | Admitting: Neurology

## 2017-03-08 DIAGNOSIS — G35 Multiple sclerosis: Secondary | ICD-10-CM

## 2017-03-11 ENCOUNTER — Other Ambulatory Visit (HOSPITAL_COMMUNITY): Payer: Self-pay | Admitting: *Deleted

## 2017-03-14 ENCOUNTER — Encounter (HOSPITAL_COMMUNITY)
Admission: RE | Admit: 2017-03-14 | Discharge: 2017-03-14 | Disposition: A | Discharge status: Home / Self Care | Payer: BLUE CROSS/BLUE SHIELD | Source: Ambulatory Visit | Attending: Neurology | Admitting: Neurology

## 2017-03-14 DIAGNOSIS — G35 Multiple sclerosis: Secondary | ICD-10-CM | POA: Diagnosis not present

## 2017-03-14 MED ORDER — METHYLPREDNISOLONE SODIUM SUCC 1000 MG IJ SOLR
1000.0000 mg | Freq: Every day | INTRAMUSCULAR | Status: DC
  Administered 2017-03-14: 1000 mg via INTRAVENOUS
  Filled 2017-03-14: qty 8

## 2017-03-15 ENCOUNTER — Encounter (HOSPITAL_COMMUNITY)
Admission: RE | Admit: 2017-03-15 | Discharge: 2017-03-15 | Disposition: A | Discharge status: Home / Self Care | Payer: BLUE CROSS/BLUE SHIELD | Source: Ambulatory Visit | Attending: Neurology | Admitting: Neurology

## 2017-03-15 DIAGNOSIS — G35 Multiple sclerosis: Secondary | ICD-10-CM | POA: Insufficient documentation

## 2017-03-15 MED ORDER — METHYLPREDNISOLONE SODIUM SUCC 1000 MG IJ SOLR
1000.0000 mg | Freq: Every day | INTRAMUSCULAR | Status: DC
  Administered 2017-03-15: 1000 mg via INTRAVENOUS
  Filled 2017-03-15: qty 8

## 2017-03-16 ENCOUNTER — Encounter (HOSPITAL_COMMUNITY)
Admission: RE | Admit: 2017-03-16 | Discharge: 2017-03-16 | Disposition: A | Discharge status: Home / Self Care | Payer: BLUE CROSS/BLUE SHIELD | Source: Ambulatory Visit | Attending: Neurology | Admitting: Neurology

## 2017-03-16 DIAGNOSIS — G35 Multiple sclerosis: Secondary | ICD-10-CM | POA: Insufficient documentation

## 2017-03-16 MED ORDER — METHYLPREDNISOLONE SODIUM SUCC 1000 MG IJ SOLR
1000.0000 mg | Freq: Every day | INTRAMUSCULAR | Status: DC
  Administered 2017-03-16: 1000 mg via INTRAVENOUS
  Filled 2017-03-16: qty 8

## 2017-03-17 ENCOUNTER — Ambulatory Visit (HOSPITAL_COMMUNITY): Payer: BLUE CROSS/BLUE SHIELD

## 2017-03-17 ENCOUNTER — Encounter (HOSPITAL_COMMUNITY)
Admission: RE | Admit: 2017-03-17 | Discharge: 2017-03-17 | Disposition: A | Discharge status: Home / Self Care | Payer: BLUE CROSS/BLUE SHIELD | Source: Ambulatory Visit | Attending: Neurology | Admitting: Neurology

## 2017-03-17 ENCOUNTER — Ambulatory Visit (HOSPITAL_COMMUNITY)
Admission: RE | Admit: 2017-03-17 | Discharge: 2017-03-17 | Disposition: A | Discharge status: Home / Self Care | Payer: BLUE CROSS/BLUE SHIELD | Source: Ambulatory Visit | Attending: Neurology | Admitting: Neurology

## 2017-03-17 DIAGNOSIS — G35 Multiple sclerosis: Secondary | ICD-10-CM | POA: Insufficient documentation

## 2017-03-17 MED ORDER — GADOBENATE DIMEGLUMINE 529 MG/ML IV SOLN: 20.0000 mL | Freq: Once | INTRAVENOUS | Status: DC | PRN

## 2017-03-17 MED ORDER — SODIUM CHLORIDE 0.9 % IV SOLN
1000.0000 mg | Freq: Every day | INTRAVENOUS | Status: DC
  Administered 2017-03-17: 1000 mg via INTRAVENOUS
  Filled 2017-03-17: qty 8

## 2017-03-17 MED ORDER — GADOBENATE DIMEGLUMINE 529 MG/ML IV SOLN
20.0000 mL | Freq: Once | INTRAVENOUS | Status: AC | PRN
  Administered 2017-03-17: 20 mL via INTRAVENOUS

## 2017-03-17 NOTE — Progress Notes (Signed)
Pt has decided to keep her iv in overnight so that she can use it for her last infusion tomorow

## 2017-03-18 ENCOUNTER — Encounter (HOSPITAL_COMMUNITY)
Admission: RE | Admit: 2017-03-18 | Discharge: 2017-03-18 | Disposition: A | Discharge status: Home / Self Care | Payer: BLUE CROSS/BLUE SHIELD | Source: Ambulatory Visit | Attending: Neurology | Admitting: Neurology

## 2017-03-18 DIAGNOSIS — G35 Multiple sclerosis: Secondary | ICD-10-CM | POA: Diagnosis not present

## 2017-03-18 MED ORDER — SODIUM CHLORIDE 0.9 % IV SOLN
1000.0000 mg | Freq: Every day | INTRAVENOUS | Status: DC
  Administered 2017-03-18: 1000 mg via INTRAVENOUS
  Filled 2017-03-18: qty 8

## 2017-04-20 ENCOUNTER — Encounter: Payer: Self-pay | Admitting: Neurology

## 2017-04-20 ENCOUNTER — Ambulatory Visit (INDEPENDENT_AMBULATORY_CARE_PROVIDER_SITE_OTHER): Payer: BLUE CROSS/BLUE SHIELD | Admitting: Neurology

## 2017-04-20 DIAGNOSIS — G35 Multiple sclerosis: Secondary | ICD-10-CM | POA: Diagnosis not present

## 2017-04-20 NOTE — Progress Notes (Signed)
Reason for visit: Multiple sclerosis  Referring physician: Dr. Yolanda Manges is a 51 y.o. female  History of present illness:  Ms. Burnsworth is a 51 year old left handed black female with a history of multiple sclerosis, previously followed through Dr. Truddie Coco. The patient had been on Tecfidera, but she ran out of the medication in January 2018. A refill for the Tecfidera was not given as the patient did not return for blood work evaluation. Within a month she developed onset of paresthesias in both upper extremities, she was treated with a 3 day course of Solu-Medrol beginning on 02/02/2017. This helped the symptoms temporarily, but she returned to the emergency room on 02/28/2017 with a 10 day history of worsening symptoms. The patient received a 5 day course of Solu-Medrol beginning on 03/14/2017. MRI of the brain and cervical spine revealed evidence of an acute C3-4 spinal cord enhancing lesion consistent with active multiple sclerosis. The patient has requested to be followed through another neurology office, and she comes to this office today. The original diagnosis of multiple sclerosis was made in 2014. Her first symptoms were noted in 2012 associated with a visual disturbance involving the right eye and some gait instability. The patient initially was felt to have had a stroke event. The patient was seen by Dr. Truddie Coco in 2014, lumbar puncture and MRI were done at that time, and she was started on Tecfidera. The patient had done quite well on this medication, but she decided to stop the drug in January 2018 as she was not having any symptoms and she was starting to question the diagnosis of multiple sclerosis. Currently, after steroid treatment the numbness that occurred on the arms and the body and legs has improved. The patient has had some residual numbness in the hands, nerve conduction studies done by Dr. Truddie Coco recently showed evidence of bilateral carpal tunnel syndrome according to  the patient. The patient is wearing wrist splints for this. The patient is still noting some problems with tingling in the arms and legs and body with neck flexion. The patient denies any weakness of the extremities or any significant change in balance. The patient denies any issues controlling the bowels or the bladder. The patient has not had any change in vision. She has been having some troubles with memory over the last year, she has been more forgetful recently. She is back on [place], she lives in [place] and seeks a neurologist that is more local.  Past Medical History:  Diagnosis Date  . Multiple sclerosis (HCC)     Past Surgical History:  Procedure Laterality Date  . CESAREAN SECTION      Family History  Problem Relation Age of Onset  . Hypertension Mother   . Post-traumatic stress disorder Father   . Hypertension Father   . Stroke Father   . Hypertension Brother   . Hypertension Brother     Social history:  reports that she has never smoked. She has never used smokeless tobacco. She reports that she does not drink alcohol or use drugs.  Medications:  Prior to Admission medications   Medication Sig Start Date End Date Taking? Authorizing Provider  Dimethyl Fumarate (TECFIDERA) 240 MG CPDR Take 240 mg by mouth 2 (two) times daily.    Historical Provider, MD  ibuprofen (ADVIL,MOTRIN) 200 MG tablet Take 400 mg by mouth every 6 (six) hours as needed for headache.    Historical Provider, MD  Vitamin D, Ergocalciferol, (DRISDOL) 50000 units CAPS  capsule Take 50,000 Units by mouth once a week. 02/25/17   Historical Provider, MD      Allergies  Allergen Reactions  . Erythromycin Shortness Of Breath    Chest pains also    ROS:  Out of a complete 14 system review of symptoms, the patient complains only of the following symptoms, and all other reviewed systems are negative.  Weight gain Blood in stool Achy muscles Memory loss, numbness  Blood pressure 119/76, pulse  90, height 5\' 4"  (1.626 m), weight 213 lb 8 oz (96.8 kg).  Physical Exam  General: The patient is alert and cooperative at the time of the examination. The patient is markedly obese.  Eyes: Pupils are equal, round, and reactive to light. Discs are flat bilaterally.  Neck: The neck is supple, no carotid bruits are noted.  Respiratory: The respiratory examination is clear.  Cardiovascular: The cardiovascular examination reveals a regular rate and rhythm, no obvious murmurs or rubs are noted.  Skin: Extremities are without significant edema.  Neurologic Exam  Mental status: The patient is alert and oriented x 3 at the time of the examination. The patient has apparent normal recent and remote memory, with an apparently normal attention span and concentration ability.  Cranial nerves: Facial symmetry is present. There is good sensation of the face to pinprick and soft touch bilaterally. The strength of the facial muscles and the muscles to head turning and shoulder shrug are normal bilaterally. Speech is well enunciated, no aphasia or dysarthria is noted. Extraocular movements are full. Visual fields are full. The tongue is midline, and the patient has symmetric elevation of the soft palate. No obvious hearing deficits are noted.  Motor: The motor testing reveals 5 over 5 strength of all 4 extremities. Good symmetric motor tone is noted throughout.  Sensory: Sensory testing is intact to pinprick, soft touch, vibration sensation, and position sense on all 4 extremities. No evidence of extinction is noted.  Coordination: Cerebellar testing reveals good finger-nose-finger and heel-to-shin bilaterally.  Gait and station: Gait is normal. Tandem gait is slightly unsteady. Romberg is negative. No drift is seen.  Reflexes: Deep tendon reflexes are symmetric and normal bilaterally. Toes are downgoing bilaterally.   MRI brain and cervical 03/18/17:  IMPRESSION: 1. 2 cm active demyelinating lesion  within the cervical spinal cord, within the posterior central aspect at the C3-C4 levels. 2. A single new supratentorial white matter lesion without any brain parenchymal contrast enhancement to indicate active cerebral lesions. 3. Otherwise unchanged distribution of white matter lesions in a pattern compatible with multiple sclerosis.  * MRI scan images were reviewed online. I agree with the written report.    Assessment/Plan:  1. Multiple sclerosis  2. Bilateral carpal tunnel syndrome  3. 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 She will follow-up in 4 months, we will check blood work at that time. The patient will contact our office if any new issues arise.  Marlan Palau MD 04/20/2017 9:11 AM  [facility] [address] [place], [place] [address]  Phone [phone] Fax [phone]

## 2017-07-09 ENCOUNTER — Encounter (HOSPITAL_COMMUNITY): Payer: Self-pay | Admitting: Emergency Medicine

## 2017-07-09 ENCOUNTER — Emergency Department (HOSPITAL_COMMUNITY)
Admission: EM | Admit: 2017-07-09 | Discharge: 2017-07-09 | Disposition: A | Discharge status: Home / Self Care | Payer: BLUE CROSS/BLUE SHIELD | Attending: Emergency Medicine | Admitting: Emergency Medicine

## 2017-07-09 DIAGNOSIS — Z79899 Other long term (current) drug therapy: Secondary | ICD-10-CM | POA: Diagnosis not present

## 2017-07-09 DIAGNOSIS — M545 Low back pain, unspecified: Secondary | ICD-10-CM

## 2017-07-09 MED ORDER — HYDROMORPHONE HCL 1 MG/ML IJ SOLN
1.0000 mg | Freq: Once | INTRAMUSCULAR | Status: AC
  Administered 2017-07-09: 1 mg via INTRAVENOUS
  Filled 2017-07-09: qty 1

## 2017-07-09 MED ORDER — ONDANSETRON HCL 4 MG/2ML IJ SOLN
4.0000 mg | Freq: Once | INTRAMUSCULAR | Status: AC
  Administered 2017-07-09: 4 mg via INTRAVENOUS
  Filled 2017-07-09: qty 2

## 2017-07-09 MED ORDER — OXYCODONE-ACETAMINOPHEN 5-325 MG PO TABS
ORAL_TABLET | ORAL | Status: DC
Start: 2017-07-09 — End: 2017-07-09
  Filled 2017-07-09: qty 1

## 2017-07-09 MED ORDER — HYDROCODONE-ACETAMINOPHEN 5-325 MG PO TABS: 1.0000 | ORAL_TABLET | Freq: Four times a day (QID) | ORAL | 0 refills | Status: DC | PRN

## 2017-07-09 MED ORDER — SODIUM CHLORIDE 0.9 % IV SOLN
INTRAVENOUS | Status: DC
Start: 2017-07-09 — End: 2017-07-09
  Administered 2017-07-09: 11:00:00 via INTRAVENOUS

## 2017-07-09 MED ORDER — OXYCODONE-ACETAMINOPHEN 5-325 MG PO TABS
1.0000 | ORAL_TABLET | ORAL | Status: DC | PRN
Start: 2017-07-09 — End: 2017-07-09
  Administered 2017-07-09: 1 via ORAL

## 2017-07-09 NOTE — ED Triage Notes (Signed)
Pt states "my back is in a lot of pain since Thursday. Pain gradually got worse since Thursday." Movement makes the pain worse. Pain starts in the middle of the back and goes down spine. Pt denies injury.

## 2017-07-09 NOTE — ED Provider Notes (Signed)
[facility] Provider Note   CSN: [id number] Arrival date & time: 07/09/17  7741     History   Chief Complaint Chief Complaint  Patient presents with  . Back Pain    HPI [name] is a 51 y.o. female.  Patient comes in for right-sided low back pain. Been present since Thursday. No injury. Pain is worse with movement. Patient was seen in urgent care yesterday treated with the tramadol muscle relaxer without any significant improvement. Pain got worse overnight. Patient has a history of MS however no exacerbations currently. Patient has no numbness or weakness to the right leg or left leg. No focal deficits. No abdominal pain. And no dysuria. No history of similar back problem. Back pain is 10 out of 10.      Past Medical History:  Diagnosis Date  . Multiple sclerosis [facility])     Patient Active Problem List   Diagnosis Date Noted  . Multiple sclerosis (HCC) 04/20/2017    Past Surgical History:  Procedure Laterality Date  . CESAREAN SECTION      OB History    No data available       Home Medications    Prior to Admission medications   Medication Sig Start Date End Date Taking? Authorizing Provider  cyclobenzaprine (FLEXERIL) 5 MG tablet Take 5 mg by mouth 3 (three) times daily as needed for muscle spasms.   Yes [provider]  Dimethyl Fumarate (TECFIDERA) 240 MG CPDR Take 240 mg by mouth 2 (two) times daily.   Yes [provider]  gabapentin (NEURONTIN) 300 MG capsule Take 300 mg by mouth 2 (two) times daily. 04/05/17  Yes [provider]  ibuprofen (ADVIL,MOTRIN) 200 MG tablet Take 400 mg by mouth every 6 (six) hours as needed for headache.   Yes [provider]  naproxen (NAPROSYN) 500 MG tablet Take 500 mg by mouth 2 (two) times daily with a meal.   Yes [provider]  Polyethyl Glycol-Propyl Glycol (SYSTANE OP) Place 1 drop into both eyes daily as needed (dry eyes).   Yes [provider]  Vitamin  D, Ergocalciferol, (DRISDOL) 50000 units CAPS capsule Take 50,000 Units by mouth once a week. 02/25/17  Yes [provider]  HYDROcodone-acetaminophen (NORCO/VICODIN) 5-325 MG tablet Take 1-2 tablets by mouth every 6 (six) hours as needed for moderate pain. 07/09/17   Vanetta Mulders, MD    Family History Family History  Problem Relation Age of Onset  . Hypertension Mother   . Post-traumatic stress disorder Father   . Hypertension Father   . Stroke Father   . Hypertension Brother   . Hypertension Brother     Social History Social History  Substance Use Topics  . Smoking status: Never Smoker  . Smokeless tobacco: Never Used  . Alcohol use No     Allergies   Erythromycin   Review of Systems Review of Systems  Constitutional: Negative for fever.  HENT: Negative for congestion.   Eyes: Negative for visual disturbance.  Respiratory: Negative for shortness of breath.   Cardiovascular: Negative for chest pain.  Gastrointestinal: Negative for abdominal pain.  Genitourinary: Negative for dysuria.  Musculoskeletal: Positive for back pain.  Skin: Negative for rash.  Neurological: Negative for weakness, numbness and headaches.  Hematological: Does not bruise/bleed easily.  Psychiatric/Behavioral: Negative for confusion.     Physical Exam Updated Vital Signs BP 114/84   Pulse 62   Temp 97.6 F (36.4 C) (Oral)   Resp 18  Ht 1.626 m (5\' 4" )   Wt 93 kg (205 lb)   LMP 07/02/2017   SpO2 97%   BMI 35.19 kg/m   Physical Exam  Constitutional: She is oriented to person, place, and time. She appears well-developed and well-nourished. No distress.  HENT:  Head: Normocephalic and atraumatic.  Mouth/Throat: Oropharynx is clear and moist.  Eyes: Pupils are equal, round, and reactive to light. Conjunctivae and EOM are normal.  Neck: Normal range of motion. Neck supple.  Cardiovascular: Normal rate, regular rhythm and normal heart sounds.   Pulmonary/Chest: Effort normal  and breath sounds normal. No respiratory distress.  Abdominal: Soft. Bowel sounds are normal.  Musculoskeletal: Normal range of motion. She exhibits tenderness.  Tenderness to palpation right mid lumbar area. No posterior spine tenderness.  Neurological: She is alert and oriented to person, place, and time. No cranial nerve deficit or sensory deficit. She exhibits normal muscle tone. Coordination normal.  Nursing note and vitals reviewed.    ED Treatments / Results  Labs (all labs ordered are listed, but only abnormal results are displayed) Labs Reviewed - No data to display  EKG  EKG Interpretation None       Radiology No results found.  Procedures Procedures (including critical care time)  Medications Ordered in ED Medications  ondansetron (ZOFRAN) injection 4 mg (4 mg Intravenous Given 07/09/17 1034)  HYDROmorphone (DILAUDID) injection 1 mg (1 mg Intravenous Given 07/09/17 1035)  HYDROmorphone (DILAUDID) injection 1 mg (1 mg Intravenous Given 07/09/17 1247)     Initial Impression / Assessment and Plan / ED Course  I have reviewed the triage vital signs and the nursing notes.  Pertinent labs & imaging results that were available during my care of the patient were reviewed by me and considered in my medical decision making (see chart for details).    Patient with a history of MS. Patient with lumbar strain on Thursday. [facility] urgent care yesterday was put on medications for it but not helping. No distal focal deficits. No direct injury to the back. No trauma to the back. Patient improved here with pain medication.   Final Clinical Impressions(s) / ED Diagnoses   Final diagnoses:  Acute right-sided low back pain without sciatica    New Prescriptions Discharge Medication List as of 07/09/2017 12:41 PM    START taking these medications   Details  HYDROcodone-acetaminophen (NORCO/VICODIN) 5-325 MG tablet Take 1-2 tablets by mouth every 6 (six) hours as needed for  moderate pain., Starting Sat 07/09/2017, Print         Vanetta Mulders, MD 07/09/17 [phone]

## 2017-07-09 NOTE — Discharge Instructions (Signed)
Follow-up with your primary. Continue other medications but stop the tramadol. Take the hydrocodone as directed. Return for any new or worse symptoms.

## 2017-08-10 ENCOUNTER — Ambulatory Visit (INDEPENDENT_AMBULATORY_CARE_PROVIDER_SITE_OTHER): Payer: BLUE CROSS/BLUE SHIELD | Admitting: Neurology

## 2017-08-10 ENCOUNTER — Encounter: Payer: Self-pay | Admitting: Neurology

## 2017-08-10 VITALS — BP 129/79 | HR 81 | Ht 64.0 in | Wt 201.0 lb

## 2017-08-10 DIAGNOSIS — G4489 Other headache syndrome: Secondary | ICD-10-CM | POA: Diagnosis not present

## 2017-08-10 DIAGNOSIS — Z5181 Encounter for therapeutic drug level monitoring: Secondary | ICD-10-CM | POA: Diagnosis not present

## 2017-08-10 DIAGNOSIS — G35 Multiple sclerosis: Secondary | ICD-10-CM

## 2017-08-10 NOTE — Patient Instructions (Signed)
   We will taper off of the gabapentin, go to one at night for 2 weeks, then stop. If you desire a medication for the headache, please call.

## 2017-08-10 NOTE — Progress Notes (Signed)
Reason for visit: Multiple sclerosis  [name] is an 51 y.o. female  History of present illness:  [name] is a 51 year old left-handed black female with a history of multiple sclerosis. The patient has been on Tecfidera, she had a significant spinal cord exacerbation when she went off the drug in early 2018 with a C3 spinal cord lesion. The patient has a history of bilateral carpal tunnel syndrome, she does have some residual numbness in the hands that could be either from carpal tunnel or from the spinal cord lesion. She occasionally will have some numbness in the feet. She has tolerated the Tecfidera well. Over the last 2-1/2 weeks she has developed daily bifrontal headaches that are better in the morning and worse as the day goes on, the headaches are low-grade and do not prevent her from functioning. She denies any photophobia, phonophobia, or nausea or vomiting. The patient denies a neck stiffness or neck discomfort. The patient was having about 1 or 2 headaches a week prior to the onset of the daily headaches. The patient does report some mild gait instability, she denies issues controlling the bowels or the bladder. She denies any new visual complaints, or numbness or weakness. She returns to the office for an evaluation.  Past Medical History:  Diagnosis Date  . Multiple sclerosis (HCC)     Past Surgical History:  Procedure Laterality Date  . CESAREAN SECTION      Family History  Problem Relation Age of Onset  . Hypertension Mother   . Post-traumatic stress disorder Father   . Hypertension Father   . Stroke Father   . Hypertension Brother   . Hypertension Brother     Social history:  reports that she has never smoked. She has never used smokeless tobacco. She reports that she does not drink alcohol or use drugs.    Allergies  Allergen Reactions  . Erythromycin Shortness Of Breath    Chest pains also    Medications:  Prior to Admission medications     Medication Sig Start Date End Date Taking? Authorizing Provider  Dimethyl Fumarate (TECFIDERA) 240 MG CPDR Take 240 mg by mouth 2 (two) times daily.   Yes [provider]  gabapentin (NEURONTIN) 300 MG capsule Take 300 mg by mouth 2 (two) times daily. 04/05/17  Yes [provider]  ibuprofen (ADVIL,MOTRIN) 200 MG tablet Take 400 mg by mouth every 6 (six) hours as needed for headache.   Yes [provider]    ROS:  Out of a complete 14 system review of symptoms, the patient complains only of the following symptoms, and all other reviewed systems are negative.  Headache  Blood pressure 129/79, pulse 81, height 5\' 4"  (1.626 m), weight 201 lb (91.2 kg).  Physical Exam  General: The patient is alert and cooperative at the time of the examination.  Skin: No significant peripheral edema is noted.   Neurologic Exam  Mental status: The patient is alert and oriented x 3 at the time of the examination. The patient has apparent normal recent and remote memory, with an apparently normal attention span and concentration ability.   Cranial nerves: Facial symmetry is present. Speech is normal, no aphasia or dysarthria is noted. Extraocular movements are full. Visual fields are full. Pupils are equal, round, and reactive to light. Discs are flat bilaterally.  Motor: The patient has good strength in all 4 extremities.  Sensory examination: Soft touch sensation is symmetric on the face, arms, and  legs.  Coordination: The patient has good finger-nose-finger and heel-to-shin bilaterally.  Gait and station: The patient has a normal gait. Tandem gait is minimally unsteady. Romberg is negative. No drift is seen.  Reflexes: Deep tendon reflexes are symmetric.   MRI brain and cervical 03/17/17:  IMPRESSION: 1. 2 cm active demyelinating lesion within the cervical spinal cord, within the posterior central aspect at the C3-C4 levels. 2. A single new supratentorial white  matter lesion without any brain parenchymal contrast enhancement to indicate active cerebral lesions. 3. Otherwise unchanged distribution of white matter lesions in a pattern compatible with multiple sclerosis.  * MRI scan images were reviewed online. I agree with the written report.    Assessment/Plan:  1. Multiple sclerosis  2. Daily headache  3. Bilateral carpal tunnel syndrome  The patient is doing well with her multiple sclerosis and she will remain on Tecfidera. We will check blood work today. She comes in today with a new problem with daily headaches, the headaches may be a muscle tension type headache. The patient is on gabapentin, she wishes to come off the medication, she does not wish to be on any other medications for the headache at this time. We may consider nortriptyline if the headaches worsen. She will go down on the gabapentin taking 1 at night for 2 weeks and then stop the drug. He will follow-up in 6 months. She will contact our office if any new issues arise.  Marlan Palau MD 08/10/2017 8:52 AM  [facility] [address] [place], [place] [address]  Phone [phone] Fax [phone]

## 2017-08-11 LAB — COMPREHENSIVE METABOLIC PANEL
ALT: 23 IU/L (ref 0–32)
AST: 21 IU/L (ref 0–40)
Albumin/Globulin Ratio: 1.8 (ref 1.2–2.2)
Albumin: 4.1 g/dL (ref 3.5–5.5)
Alkaline Phosphatase: 49 IU/L (ref 39–117)
BUN/Creatinine Ratio: 13 (ref 9–23)
BUN: 10 mg/dL (ref 6–24)
Bilirubin Total: 0.2 mg/dL (ref 0.0–1.2)
CO2: 24 mmol/L (ref 20–29)
Calcium: 9.5 mg/dL (ref 8.7–10.2)
Chloride: 104 mmol/L (ref 96–106)
Creatinine, Ser: 0.75 mg/dL (ref 0.57–1.00)
GFR calc Af Amer: 109 mL/min/{1.73_m2} (ref >59)
GFR calc non Af Amer: 95 mL/min/{1.73_m2} (ref >59)
Globulin, Total: 2.3 g/dL (ref 1.5–4.5)
Glucose: 97 mg/dL (ref 65–99)
Potassium: 4.2 mmol/L (ref 3.5–5.2)
Sodium: 140 mmol/L (ref 134–144)
Total Protein: 6.4 g/dL (ref 6.0–8.5)

## 2017-08-11 LAB — CBC WITH DIFFERENTIAL/PLATELET
Basophils Absolute: 0 10*3/uL (ref 0.0–0.2)
Basos: 0 %
EOS (ABSOLUTE): 0.2 10*3/uL (ref 0.0–0.4)
Eos: 4 %
Hematocrit: 36.3 % (ref 34.0–46.6)
Hemoglobin: 11.7 g/dL (ref 11.1–15.9)
Immature Grans (Abs): 0 10*3/uL (ref 0.0–0.1)
Immature Granulocytes: 0 %
Lymphocytes Absolute: 1.5 10*3/uL (ref 0.7–3.1)
Lymphs: 37 %
MCH: 28.6 pg (ref 26.6–33.0)
MCHC: 32.2 g/dL (ref 31.5–35.7)
MCV: 89 fL (ref 79–97)
Monocytes Absolute: 0.4 10*3/uL (ref 0.1–0.9)
Monocytes: 10 %
Neutrophils Absolute: 1.9 10*3/uL (ref 1.4–7.0)
Neutrophils: 49 %
Platelets: 305 10*3/uL (ref 150–379)
RBC: 4.09 x10E6/uL (ref 3.77–5.28)
RDW: 14.4 % (ref 12.3–15.4)
WBC: 4 10*3/uL (ref 3.4–10.8)

## 2017-11-08 ENCOUNTER — Telehealth: Payer: Self-pay | Admitting: Neurology

## 2017-11-08 NOTE — Telephone Encounter (Signed)
I called the patient.  Okay to take over-the-counter cold or flu preparations as long as she follows directions on the product.

## 2017-11-08 NOTE — Telephone Encounter (Signed)
Patients wants to know if she can take a daytime cold and flu medication since she is taking Tecfidera.

## 2018-01-05 ENCOUNTER — Telehealth: Payer: Self-pay | Admitting: Neurology

## 2018-01-05 NOTE — Telephone Encounter (Signed)
LVM returning pt call. Gave [facility] phone number.  

## 2018-01-05 NOTE — Telephone Encounter (Signed)
Pt is wanting a call back regarding Dimethyl Fumarate (TECFIDERA) 240 MG CPDR didn't want to discuss further with me

## 2018-01-09 NOTE — Telephone Encounter (Signed)
Called and LVM for pt advising I LVM last week as well. Advised she can call back at her convenience for [place] to address her questions about rx Tecfidera. Gave [facility] phone number.

## 2018-02-01 ENCOUNTER — Other Ambulatory Visit: Payer: Self-pay | Admitting: *Deleted

## 2018-02-01 DIAGNOSIS — Z1231 Encounter for screening mammogram for malignant neoplasm of breast: Secondary | ICD-10-CM

## 2018-02-10 ENCOUNTER — Ambulatory Visit: Payer: BLUE CROSS/BLUE SHIELD | Admitting: Nurse Practitioner

## 2018-02-22 ENCOUNTER — Ambulatory Visit
Admission: RE | Admit: 2018-02-22 | Discharge: 2018-02-22 | Disposition: A | Discharge status: Home / Self Care | Payer: BLUE CROSS/BLUE SHIELD | Source: Ambulatory Visit | Attending: *Deleted | Admitting: *Deleted

## 2018-02-22 DIAGNOSIS — Z1231 Encounter for screening mammogram for malignant neoplasm of breast: Secondary | ICD-10-CM

## 2018-03-13 NOTE — Progress Notes (Signed)
[facility]  PATIENT: [name] DOB: 1966-01-15   REASON FOR VISIT: Follow-up for relapsing remitting multiple sclerosis, headaches HISTORY FROM: Patient    HISTORY OF PRESENT ILLNESS:UPDATE 3/20/2019CM [name], 52 year old female returns for follow-up with a history of relapsing remitting multiple sclerosis.  Diagnosis was made in 2014.  She is currently on tecfidera denies any flushing side effects.  She occasionally will have some red spots on her skin that go away on their own.  She feels her headaches are okay at present she may have one headache per week relieved with ibuprofen.  She is off gabapentin.  She rarely exercises.  She has occasional numbness in the arms and hands.  She has a history of bilateral carpal tunnel syndrome.  She denies any balance issues, any falls, any weakness, difficulty controlling bowel or bladder.  She denies any visual symptoms.  Most recent MRI of the brain March 18, 2017 compatible with multiple sclerosis.  MRI of cervical spine 2 cm active demyelinating lesion within the cervical spinal cord,within the posterior central aspect at the C3-C4 levels.2. A single new supratentorial white matter lesion without any brain parenchymal contrast enhancement to indicate active cerebral lesions.3. Otherwise unchanged distribution of white matter lesions in apattern compatible with multiple sclerosis.  Patient had been off of her medication for 2 months when the MRI was done.  She returns for reevaluation 08/10/17 [facility]. [name] is a 52 year old left-handed black female with a history of multiple sclerosis. The patient has been on Tecfidera, she had a significant spinal cord exacerbation when she went off the drug in early 2018 with a C3 spinal cord lesion. The patient has a history of bilateral carpal tunnel syndrome, she does have some residual numbness in the hands that could be either from carpal tunnel or from the spinal cord lesion. She  occasionally will have some numbness in the feet. She has tolerated the Tecfidera well. Over the last 2-1/2 weeks she has developed daily bifrontal headaches that are better in the morning and worse as the day goes on, the headaches are low-grade and do not prevent her from functioning. She denies any photophobia, phonophobia, or nausea or vomiting. The patient denies a neck stiffness or neck discomfort. The patient was having about 1 or 2 headaches a week prior to the onset of the daily headaches. The patient does report some mild gait instability, she denies issues controlling the bowels or the bladder. She denies any new visual complaints, or numbness or weakness. She returns to the office for an evaluation.   REVIEW OF SYSTEMS: Full 14 system review of systems performed and notable only for those listed, all others are neg:  Constitutional: neg  Cardiovascular: neg Ear/Nose/Throat: neg  Skin: neg Eyes: neg Respiratory: neg Gastroitestinal: neg  Hematology/Lymphatic: neg  Endocrine: neg Musculoskeletal:neg Allergy/Immunology: neg Neurological: Occasional numbness in the hands, history of carpal tunnel syndrome Psychiatric: neg Sleep : neg   ALLERGIES: Allergies  Allergen Reactions  . Erythromycin Shortness Of Breath    Chest pains also    HOME MEDICATIONS: Outpatient Medications Prior to Visit  Medication Sig Dispense Refill  . Dimethyl Fumarate (TECFIDERA) 240 MG CPDR Take 240 mg by mouth 2 (two) times daily.    Marland Kitchen ibuprofen (ADVIL,MOTRIN) 200 MG tablet Take 400 mg by mouth every 6 (six) hours as needed for headache.     No facility-administered medications prior to visit.     PAST MEDICAL HISTORY: Past Medical History:  Diagnosis Date  .  Multiple sclerosis (HCC)     PAST SURGICAL HISTORY: Past Surgical History:  Procedure Laterality Date  . CESAREAN SECTION      FAMILY HISTORY: Family History  Problem Relation Age of Onset  . Hypertension Mother   .  Post-traumatic stress disorder Father   . Hypertension Father   . Stroke Father   . Hypertension Brother   . Hypertension Brother     SOCIAL HISTORY: Social History   Socioeconomic History  . Marital status: Married    Spouse name: [name]  . Number of children: 4  . Years of education: 40  . Highest education level: Not on file  Social Needs  . Financial resource strain: Not on file  . Food insecurity - worry: Not on file  . Food insecurity - inability: Not on file  . Transportation needs - medical: Not on file  . Transportation needs - non-medical: Not on file  Occupational History  . Not on file  Tobacco Use  . Smoking status: Never Smoker  . Smokeless tobacco: Never Used  Substance and Sexual Activity  . Alcohol use: No  . Drug use: No  . Sexual activity: Not on file  Other Topics Concern  . Not on file  Social History Narrative   Lives with husband   Caffeine use: Sometimes tea, soda sometimes    Left-handed     PHYSICAL EXAM  Vitals:   03/15/18 1002  BP: 130/75  Pulse: 82  Weight: 185 lb (83.9 kg)   Body mass index is 31.76 kg/m.  Generalized: Well developed, in no acute distress  Head: normocephalic and atraumatic,. Oropharynx benign  Neck: Supple,   Cardiac: Regular rate rhythm, no murmur  Musculoskeletal: No deformity   Neurological examination   Mentation: Alert oriented to time, place, history taking. Attention span and concentration appropriate. Recent and remote memory intact.  Follows all commands speech and language fluent.   Cranial nerve II-XII: Visual acuity 20/30 bilaterally.  Fundoscopic exam reveals sharp disc margins.Pupils were equal round reactive to light extraocular movements were full, visual field were full on confrontational test. Facial sensation and strength were normal. hearing was intact to finger rubbing bilaterally. Uvula tongue midline. head turning and shoulder shrug were normal and symmetric.Tongue protrusion into  cheek strength was normal. Motor: normal bulk and tone, full strength in the BUE, BLE,  Sensory: normal and symmetric to light touch, pinprick, and  Vibration, in the upper and lower extremities Coordination: finger-nose-finger, heel-to-shin bilaterally, no dysmetria, no tremor Reflexes: Brachioradialis 2/2, biceps 2/2, triceps 2/2, patellar 2/2, Achilles 2/2, plantar responses were flexor bilaterally. Gait and Station: Rising up from seated position without assistance, normal stance,  moderate stride, good arm swing, smooth turning, able to perform tiptoe, and heel walking without difficulty. Tandem gait is steady  DIAGNOSTIC DATA (LABS, IMAGING, TESTING) - I reviewed patient records, labs, notes, testing and imaging myself where available.  Lab Results  Component Value Date   WBC 4.0 08/10/2017   HGB 11.7 08/10/2017   HCT 36.3 08/10/2017   MCV 89 08/10/2017   PLT 305 08/10/2017      Component Value Date/Time   NA 140 08/10/2017 0935   K 4.2 08/10/2017 0935   CL 104 08/10/2017 0935   CO2 24 08/10/2017 0935   GLUCOSE 97 08/10/2017 0935   GLUCOSE 108 (H) 02/28/2017 1618   BUN 10 08/10/2017 0935   CREATININE 0.75 08/10/2017 0935   CALCIUM 9.5 08/10/2017 0935   PROT 6.4 08/10/2017 0935  ALBUMIN 4.1 08/10/2017 0935   AST 21 08/10/2017 0935   ALT 23 08/10/2017 0935   ALKPHOS 49 08/10/2017 0935   BILITOT 0.2 08/10/2017 0935   GFRNONAA 95 08/10/2017 0935   GFRAA 109 08/10/2017 0935     ASSESSMENT AND PLAN  52 y.o. year old female  has a past medical history of Multiple sclerosis (HCC). here to follow-up for her relapsing remitting multiple sclerosis and history of headaches.  She also has a history of carpal tunnel syndrome.  The patient is on tecfidera and has done well with the medication with minimal side effects.    PLAN: Continue tecfidera  twice daily will refill We will check labs today to monitor adverse effects of tecfidera Recommend routine exercise at least 30  minutes daily Follow-up in 6 months We will repeat MRI at next visit I spent 25 minutes in total face to face time with the patient more than 50% of which was spent counseling and coordination of care, reviewing test results reviewing medications and discussing and reviewing the diagnosis of multiple sclerosis and further management.  Discussed importance of follow-up MRIs and medication compliance.   Nilda Riggs, [facility], [facility], APRN  [facility] [address], [address] [place], [place] [address] [phone] Nilda Riggs, [facility], [facility], APRN

## 2018-03-15 ENCOUNTER — Encounter: Payer: Self-pay | Admitting: Nurse Practitioner

## 2018-03-15 ENCOUNTER — Ambulatory Visit: Payer: BLUE CROSS/BLUE SHIELD | Admitting: Nurse Practitioner

## 2018-03-15 VITALS — BP 130/75 | HR 82 | Wt 185.0 lb

## 2018-03-15 DIAGNOSIS — G35 Multiple sclerosis: Secondary | ICD-10-CM | POA: Diagnosis not present

## 2018-03-15 DIAGNOSIS — Z5181 Encounter for therapeutic drug level monitoring: Secondary | ICD-10-CM | POA: Diagnosis not present

## 2018-03-15 MED ORDER — DIMETHYL FUMARATE 240 MG PO CPDR: 240.0000 mg | DELAYED_RELEASE_CAPSULE | Freq: Two times a day (BID) | ORAL | 1 refills | Status: DC

## 2018-03-15 NOTE — Progress Notes (Signed)
I have read the note, and I agree with the clinical assessment and plan.  Elnathan Fulford K Holston Oyama   

## 2018-03-15 NOTE — Patient Instructions (Signed)
Continue tecfidera  twice daily will refill We will check labs today to monitor adverse effects of tecfidera Recommend routine exercise at least 30 minutes daily Follow-up in 6 months

## 2018-03-16 ENCOUNTER — Telehealth: Payer: Self-pay | Admitting: *Deleted

## 2018-03-16 LAB — COMPREHENSIVE METABOLIC PANEL
ALT: 9 IU/L (ref 0–32)
AST: 15 IU/L (ref 0–40)
Albumin/Globulin Ratio: 1.6 (ref 1.2–2.2)
Albumin: 4.2 g/dL (ref 3.5–5.5)
Alkaline Phosphatase: 58 IU/L (ref 39–117)
BUN/Creatinine Ratio: 13 (ref 9–23)
BUN: 10 mg/dL (ref 6–24)
Bilirubin Total: 0.2 mg/dL (ref 0.0–1.2)
CO2: 25 mmol/L (ref 20–29)
Calcium: 9.3 mg/dL (ref 8.7–10.2)
Chloride: 103 mmol/L (ref 96–106)
Creatinine, Ser: 0.76 mg/dL (ref 0.57–1.00)
GFR calc Af Amer: 107 mL/min/{1.73_m2} (ref >59)
GFR calc non Af Amer: 92 mL/min/{1.73_m2} (ref >59)
Globulin, Total: 2.6 g/dL (ref 1.5–4.5)
Glucose: 93 mg/dL (ref 65–99)
Potassium: 3.9 mmol/L (ref 3.5–5.2)
Sodium: 143 mmol/L (ref 134–144)
Total Protein: 6.8 g/dL (ref 6.0–8.5)

## 2018-03-16 LAB — CBC WITH DIFFERENTIAL/PLATELET
Basophils Absolute: 0 10*3/uL (ref 0.0–0.2)
Basos: 0 %
EOS (ABSOLUTE): 0.1 10*3/uL (ref 0.0–0.4)
Eos: 1 %
Hematocrit: 38.3 % (ref 34.0–46.6)
Hemoglobin: 12.4 g/dL (ref 11.1–15.9)
Immature Grans (Abs): 0 10*3/uL (ref 0.0–0.1)
Immature Granulocytes: 0 %
Lymphocytes Absolute: 1.5 10*3/uL (ref 0.7–3.1)
Lymphs: 30 %
MCH: 29.2 pg (ref 26.6–33.0)
MCHC: 32.4 g/dL (ref 31.5–35.7)
MCV: 90 fL (ref 79–97)
Monocytes Absolute: 0.4 10*3/uL (ref 0.1–0.9)
Monocytes: 9 %
Neutrophils Absolute: 3 10*3/uL (ref 1.4–7.0)
Neutrophils: 60 %
Platelets: 353 10*3/uL (ref 150–379)
RBC: 4.25 x10E6/uL (ref 3.77–5.28)
RDW: 13.4 % (ref 12.3–15.4)
WBC: 5.1 10*3/uL (ref 3.4–10.8)

## 2018-03-16 NOTE — Telephone Encounter (Signed)
Spoke with patient and informed her that her lab results look good. She verbalized understanding, appreciation for call.

## 2018-05-25 ENCOUNTER — Telehealth: Payer: Self-pay | Admitting: *Deleted

## 2018-05-25 NOTE — Telephone Encounter (Signed)
Received fax from [facility] stating they've not been able to reach patient. She stated that she has gotten messages from them but has not run out of medication. She is taking Tecfidera twice daily. This RN advised she go ahead and schedule her next delivery in the event it takes a week or so for her to receive it. She stated she has their number and will call them today. She verbalized understanding, appreciation of call.

## 2018-09-15 NOTE — Progress Notes (Signed)
[name]  PATIENT: [name] DOB: May 10, 1966   REASON FOR VISIT: Follow-up for relapsing remitting multiple sclerosis, headaches HISTORY FROM: Patient    HISTORY OF PRESENT ILLNESS:UPDATE 9/23/2019CM [name], 52 year old female returns for follow-up with history of relapsing remitting multiple sclerosis.  She is currently on tecfidera without side effects.  She continues to have occasional numbness in the arms and legs.  She also has a history of bilateral carpal tunnel syndrome.  She has not had any falls, balance issues.She denies problems with bowel/bladder control.  She denies any visual symptoms.  Her headaches are in good control.  She currently homeschools her child and takes care of 3 young grandchildren. Most recent MRI of the brain March 18, 2017 compatible with multiple sclerosis.  She returns for reevaluation    UPDATE 3/20/2019CM [name], 52 year old female returns for follow-up with a history of relapsing remitting multiple sclerosis.  Diagnosis was made in 2014.  She is currently on tecfidera denies any flushing side effects.  She occasionally will have some red spots on her skin that go away on their own.  She feels her headaches are okay at present she may have one headache per week relieved with ibuprofen.  She is off gabapentin.  She rarely exercises.  She has occasional numbness in the arms and hands.  She has a history of bilateral carpal tunnel syndrome.  She denies any balance issues, any falls, any weakness, difficulty controlling bowel or bladder.  She denies any visual symptoms.  Most recent MRI of the brain March 18, 2017 compatible with multiple sclerosis.  MRI of cervical spine 2 cm active demyelinating lesion within the cervical spinal cord,within the posterior central aspect at the C3-C4 levels.2. A single new supratentorial white matter lesion without any brain parenchymal contrast enhancement to indicate active cerebral lesions.3.  Otherwise unchanged distribution of white matter lesions in apattern compatible with multiple sclerosis.  Patient had been off of her medication for 2 months when the MRI was done.  She returns for reevaluation 08/10/17 KWMs. [name] is a 52 year old left-handed black female with a history of multiple sclerosis. The patient has been on Tecfidera, she had a significant spinal cord exacerbation when she went off the drug in early 2018 with a C3 spinal cord lesion. The patient has a history of bilateral carpal tunnel syndrome, she does have some residual numbness in the hands that could be either from carpal tunnel or from the spinal cord lesion. She occasionally will have some numbness in the feet. She has tolerated the Tecfidera well. Over the last 2-1/2 weeks she has developed daily bifrontal headaches that are better in the morning and worse as the day goes on, the headaches are low-grade and do not prevent her from functioning. She denies any photophobia, phonophobia, or nausea or vomiting. The patient denies a neck stiffness or neck discomfort. The patient was having about 1 or 2 headaches a week prior to the onset of the daily headaches. The patient does report some mild gait instability, she denies issues controlling the bowels or the bladder. She denies any new visual complaints, or numbness or weakness. She returns to the office for an evaluation.   REVIEW OF SYSTEMS: Full 14 system review of systems performed and notable only for those listed, all others are neg:  Constitutional: neg  Cardiovascular: neg Ear/Nose/Throat: neg  Skin: neg Eyes: neg Respiratory: neg Gastroitestinal: neg  Hematology/Lymphatic: neg  Endocrine: neg Musculoskeletal:neg Allergy/Immunology: neg Neurological: Occasional numbness in  the hands, history of carpal tunnel syndrome Psychiatric: neg Sleep : neg   ALLERGIES: Allergies  Allergen Reactions  . Erythromycin Shortness Of Breath    Chest pains also    HOME  MEDICATIONS: Outpatient Medications Prior to Visit  Medication Sig Dispense Refill  . Dimethyl Fumarate (TECFIDERA) 240 MG CPDR Take 1 capsule (240 mg total) by mouth 2 (two) times daily. 180 capsule 1  . ibuprofen (ADVIL,MOTRIN) 200 MG tablet Take 400 mg by mouth every 6 (six) hours as needed for headache.     No facility-administered medications prior to visit.     PAST MEDICAL HISTORY: Past Medical History:  Diagnosis Date  . Multiple sclerosis (HCC)     PAST SURGICAL HISTORY: Past Surgical History:  Procedure Laterality Date  . CESAREAN SECTION      FAMILY HISTORY: Family History  Problem Relation Age of Onset  . Hypertension Mother   . Post-traumatic stress disorder Father   . Hypertension Father   . Stroke Father   . Hypertension Brother   . Hypertension Brother     SOCIAL HISTORY: Social History   Socioeconomic History  . Marital status: Married    Spouse name: Primitivo Gauze  . Number of children: 4  . Years of education: 61  . Highest education level: Not on file  Occupational History  . Not on file  Social Needs  . Financial resource strain: Not on file  . Food insecurity:    Worry: Not on file    Inability: Not on file  . Transportation needs:    Medical: Not on file    Non-medical: Not on file  Tobacco Use  . Smoking status: Never Smoker  . Smokeless tobacco: Never Used  Substance and Sexual Activity  . Alcohol use: No  . Drug use: No  . Sexual activity: Not on file  Lifestyle  . Physical activity:    Days per week: Not on file    Minutes per session: Not on file  . Stress: Not on file  Relationships  . Social connections:    Talks on phone: Not on file    Gets together: Not on file    Attends religious service: Not on file    Active member of club or organization: Not on file    Attends meetings of clubs or organizations: Not on file    Relationship status: Not on file  . Intimate partner violence:    Fear of current or ex partner: Not on  file    Emotionally abused: Not on file    Physically abused: Not on file    Forced sexual activity: Not on file  Other Topics Concern  . Not on file  Social History Narrative   Lives with husband   Caffeine use: Sometimes tea, soda sometimes    Left-handed     PHYSICAL EXAM  Vitals:   09/18/18 1130  BP: 136/66  Pulse: 62  Weight: 196 lb 6.4 oz (89.1 kg)  Height: 5\' 4"  (1.626 m)   Body mass index is 33.71 kg/m.  Generalized: Well developed, obese female in no acute distress  Head: normocephalic and atraumatic,. Oropharynx benign  Neck: Supple,   Cardiac: Regular rate rhythm, no murmur  Musculoskeletal: No deformity   Neurological examination   Mentation: Alert oriented to time, place, history taking. Attention span and concentration appropriate. Recent and remote memory intact.  Follows all commands speech and language fluent.   Cranial nerve II-XII: Visual acuity 20/30 bilaterally.  Fundoscopic exam  reveals sharp disc margins.Pupils were equal round reactive to light extraocular movements were full, visual field were full on confrontational test. Facial sensation and strength were normal. hearing was intact to finger rubbing bilaterally. Uvula tongue midline. head turning and shoulder shrug were normal and symmetric.Tongue protrusion into cheek strength was normal. Motor: normal bulk and tone, full strength in the BUE, BLE,  Sensory: normal and symmetric to light touch, pinprick, and  Vibration, in the upper and lower extremities Coordination: finger-nose-finger, heel-to-shin bilaterally, no dysmetria, no tremor Reflexes: Symmetric upper and lower plantar responses were flexor bilaterally. Gait and Station: Rising up from seated position without assistance, normal stance,  moderate stride, good arm swing, smooth turning, able to perform tiptoe, and heel walking without difficulty. Tandem gait is steady  DIAGNOSTIC DATA (LABS, IMAGING, TESTING) - I reviewed patient records,  labs, notes, testing and imaging myself where available.  Lab Results  Component Value Date   WBC 5.1 03/15/2018   HGB 12.4 03/15/2018   HCT 38.3 03/15/2018   MCV 90 03/15/2018   PLT 353 03/15/2018      Component Value Date/Time   NA 143 03/15/2018 1048   K 3.9 03/15/2018 1048   CL 103 03/15/2018 1048   CO2 25 03/15/2018 1048   GLUCOSE 93 03/15/2018 1048   GLUCOSE 108 (H) 02/28/2017 1618   BUN 10 03/15/2018 1048   CREATININE 0.76 03/15/2018 1048   CALCIUM 9.3 03/15/2018 1048   PROT 6.8 03/15/2018 1048   ALBUMIN 4.2 03/15/2018 1048   AST 15 03/15/2018 1048   ALT 9 03/15/2018 1048   ALKPHOS 58 03/15/2018 1048   BILITOT 0.2 03/15/2018 1048   GFRNONAA 92 03/15/2018 1048   GFRAA 107 03/15/2018 1048     ASSESSMENT AND PLAN  52 y.o. year old female  has a past medical history of Multiple sclerosis (HCC). here to follow-up for her relapsing remitting multiple sclerosis and history of headaches.  She also has a history of carpal tunnel syndrome.  The patient is on tecfidera and has done well with the medication without side effects.    PLAN: Continue tecfidera  twice daily will refill We will check labs today to monitor adverse effects of tecfidera CBC and CMP Recommend routine exercise at least 30 minutes daily We will repeat MRI of the brain and compared to 2018  follow up in 6 months I spent 25 minutes in total face to face time with the patient more than 50% of which was spent counseling and coordination of care, reviewing test results reviewing medications and discussing and reviewing the diagnosis of multiple sclerosis and further management.  Discussed importance of follow-up MRIs and medication compliance.   Nilda Riggs, [facility], [facility], APRN  [name] [address] [place], [place] [address] [phone] Nilda Riggs, [facility], [facility], APRN

## 2018-09-18 ENCOUNTER — Encounter: Payer: Self-pay | Admitting: Nurse Practitioner

## 2018-09-18 ENCOUNTER — Ambulatory Visit: Payer: BLUE CROSS/BLUE SHIELD | Admitting: Nurse Practitioner

## 2018-09-18 ENCOUNTER — Telehealth: Payer: Self-pay | Admitting: Nurse Practitioner

## 2018-09-18 VITALS — BP 136/66 | HR 62 | Ht 64.0 in | Wt 196.4 lb

## 2018-09-18 DIAGNOSIS — G35 Multiple sclerosis: Secondary | ICD-10-CM

## 2018-09-18 DIAGNOSIS — Z5181 Encounter for therapeutic drug level monitoring: Secondary | ICD-10-CM | POA: Diagnosis not present

## 2018-09-18 MED ORDER — DIMETHYL FUMARATE 240 MG PO CPDR: 240.0000 mg | DELAYED_RELEASE_CAPSULE | Freq: Two times a day (BID) | ORAL | 1 refills | Status: DC

## 2018-09-18 NOTE — Telephone Encounter (Signed)
MR Brain w/wo contrast Darrol Angel [employer] Auth: [id number] (exp. 09/18/18 to 10/17/18). Patient is scheduled at [facility] for 09/19/18.

## 2018-09-18 NOTE — Progress Notes (Signed)
I have read the note, and I agree with the clinical assessment and plan.  Trejuan Matherne K Jadin Creque   

## 2018-09-18 NOTE — Patient Instructions (Addendum)
Continue tecfidera  twice daily will refill We will check labs today to monitor adverse effects of tecfidera CBC and CMP Recommend routine exercise at least 30 minutes daily We will repeat MRI of the brain Follow up in 6 months

## 2018-09-19 ENCOUNTER — Ambulatory Visit (INDEPENDENT_AMBULATORY_CARE_PROVIDER_SITE_OTHER): Payer: BLUE CROSS/BLUE SHIELD

## 2018-09-19 ENCOUNTER — Telehealth: Payer: Self-pay

## 2018-09-19 DIAGNOSIS — G35 Multiple sclerosis: Secondary | ICD-10-CM

## 2018-09-19 LAB — CBC WITH DIFFERENTIAL/PLATELET
Basophils Absolute: 0 10*3/uL (ref 0.0–0.2)
Basos: 1 %
EOS (ABSOLUTE): 0.1 10*3/uL (ref 0.0–0.4)
Eos: 3 %
Hematocrit: 35.4 % (ref 34.0–46.6)
Hemoglobin: 11.6 g/dL (ref 11.1–15.9)
Immature Grans (Abs): 0 10*3/uL (ref 0.0–0.1)
Immature Granulocytes: 0 %
Lymphocytes Absolute: 1.7 10*3/uL (ref 0.7–3.1)
Lymphs: 30 %
MCH: 29.3 pg (ref 26.6–33.0)
MCHC: 32.8 g/dL (ref 31.5–35.7)
MCV: 89 fL (ref 79–97)
Monocytes Absolute: 0.7 10*3/uL (ref 0.1–0.9)
Monocytes: 12 %
Neutrophils Absolute: 3.2 10*3/uL (ref 1.4–7.0)
Neutrophils: 54 %
Platelets: 302 10*3/uL (ref 150–450)
RBC: 3.96 x10E6/uL (ref 3.77–5.28)
RDW: 12.4 % (ref 12.3–15.4)
WBC: 5.7 10*3/uL (ref 3.4–10.8)

## 2018-09-19 LAB — COMPREHENSIVE METABOLIC PANEL
ALT: 12 IU/L (ref 0–32)
AST: 13 IU/L (ref 0–40)
Albumin/Globulin Ratio: 2 (ref 1.2–2.2)
Albumin: 4.3 g/dL (ref 3.5–5.5)
Alkaline Phosphatase: 60 IU/L (ref 39–117)
BUN/Creatinine Ratio: 21 (ref 9–23)
BUN: 15 mg/dL (ref 6–24)
Bilirubin Total: <0.2 mg/dL (ref 0.0–1.2)
CO2: 24 mmol/L (ref 20–29)
Calcium: 9.4 mg/dL (ref 8.7–10.2)
Chloride: 102 mmol/L (ref 96–106)
Creatinine, Ser: 0.73 mg/dL (ref 0.57–1.00)
GFR calc Af Amer: 111 mL/min/{1.73_m2} (ref >59)
GFR calc non Af Amer: 96 mL/min/{1.73_m2} (ref >59)
Globulin, Total: 2.2 g/dL (ref 1.5–4.5)
Glucose: 81 mg/dL (ref 65–99)
Potassium: 4.2 mmol/L (ref 3.5–5.2)
Sodium: 142 mmol/L (ref 134–144)
Total Protein: 6.5 g/dL (ref 6.0–8.5)

## 2018-09-19 MED ORDER — GADOBENATE DIMEGLUMINE 529 MG/ML IV SOLN
18.0000 mL | Freq: Once | INTRAVENOUS | Status: AC | PRN
  Administered 2018-09-19: 18 mL via INTRAVENOUS

## 2018-09-19 NOTE — Telephone Encounter (Signed)
Unable to get in contact with the patient. A detailed message was left on her voicemail( DPR verified) on her lab results. Office number was provided in case she has any further questions.

## 2018-09-19 NOTE — Telephone Encounter (Signed)
-----   Message from Nilda Riggs, NP sent at 09/19/2018  8:19 AM EDT ----- Labs stable please call the patient

## 2018-09-21 ENCOUNTER — Telehealth: Payer: Self-pay | Admitting: Neurology

## 2018-09-21 NOTE — Telephone Encounter (Signed)
  I called the patient.  MRI of the brain appears to be stable from prior study done in March 2018.  I discussed this with the patient.  MRI brain 09/21/18:  IMPRESSION:   MRI brain (with and without) demonstrating: - Left thalamic and right temporal chronic demyelinating plaques.  Scattered subtle pericallosal T2 hyperintensities also noted. - No abnormal lesions are seen on post contrast views.   - No new plaques compared to MRI on 03/17/17.

## 2018-12-01 ENCOUNTER — Telehealth: Payer: Self-pay | Admitting: Nurse Practitioner

## 2018-12-01 NOTE — Telephone Encounter (Signed)
Rn call patient back about needing clearance for hip surgery. Rn stated per her med list she is not on any blood thinners. Rn stated also MS oral medication is not stop prior to surgery. RN advised pt have the office call back on Monday to clarify what kind of clearance they need. PT verbalized understanding.

## 2018-12-01 NOTE — Telephone Encounter (Signed)
Pt requesting a call stating she is having surgery on her hip on 12/26, she is needing a clearance letter faxed to Dr. Eulah Pont # [id number] [facility]. Please advise.

## 2018-12-06 NOTE — Telephone Encounter (Signed)
Patient was last seen in the office in September for relapsing remitting multiple sclerosis.  She was stable at that time.  Repeat MRI of the brain without new lesions.  Per Dr. Anne Hahn he thought he took care of this yesterday

## 2018-12-06 NOTE — Telephone Encounter (Signed)
Refaxed to [place] at [phone] with fax confirmation.  I spoke to [place] at [facility].

## 2018-12-06 NOTE — Telephone Encounter (Signed)
Tresa Endo with [facility] calling to get clearance for patient to have a total hip replacement since she has MS and she would be safe to undergo anesthesia. Please call Tresa Endo at [phone] and advise.

## 2018-12-06 NOTE — Telephone Encounter (Signed)
Spoke to [place] and she did receive, and pt coming this afternoon for preop.

## 2019-02-12 NOTE — Progress Notes (Signed)
[facility]  PATIENT: [name] [name] DOB: 12-15-1966   REASON FOR VISIT: Follow-up for relapsing remitting multiple sclerosis, headaches HISTORY FROM: Patient    HISTORY OF PRESENT ILLNESS:UPDATE 2/18/2020CM [name] [name], 53 year old female returns for follow-up with history of relapsing remitting multiple sclerosis and history of headaches in the past however she has never been on preventive medication.  She had left hip replacement in December.  She has concluded her rehab.  She states since her surgery because she had to sleep on her back her neck began bothering her and she also has some pain in her arms and neck and upper back.  It may be positional.  She claims she is only been taking ibuprofen periodically.  She has a history of carpal tunnel syndrome.  She is currently on tecfidera for her multiple sclerosis relapsing remitting.  Most recent MRI of the brain 09/19/18 MRI brain (with and without) demonstrating: - Left thalamic and right temporal chronic demyelinating plaques.  Scattered subtle pericallosal T2 hyperintensities also noted. - No abnormal lesions are seen on post contrast views.   - No new plaques compared to MRI on 03/17/17.  She denies any visual changes any increased spasticity any speech or swallowing difficulty bowel or bladder issues.  Since her surgery she does not sleep well at night because she cannot get in a comfortable position.  She returns for reevaluation   UPDATE 9/23/2019CM [name] [name], 53 year old female returns for follow-up with history of relapsing remitting multiple sclerosis.  She is currently on tecfidera without side effects.  She continues to have occasional numbness in the arms and legs.  She also has a history of bilateral carpal tunnel syndrome.  She has not had any falls, balance issues.She denies problems with bowel/bladder control.  She denies any visual symptoms.  Her headaches are in good control.  She currently homeschools her  child and takes care of 3 young grandchildren. Most recent MRI of the brain March 18, 2017 compatible with multiple sclerosis.  She returns for reevaluation    UPDATE 3/20/2019CM [name] [name], 53 year old female returns for follow-up with a history of relapsing remitting multiple sclerosis.  Diagnosis was made in 2014.  She is currently on tecfidera denies any flushing side effects.  She occasionally will have some red spots on her skin that go away on their own.  She feels her headaches are okay at present she may have one headache per week relieved with ibuprofen.  She is off gabapentin.  She rarely exercises.  She has occasional numbness in the arms and hands.  She has a history of bilateral carpal tunnel syndrome.  She denies any balance issues, any falls, any weakness, difficulty controlling bowel or bladder.  She denies any visual symptoms.  Most recent MRI of the brain March 18, 2017 compatible with multiple sclerosis.  MRI of cervical spine 2 cm active demyelinating lesion within the cervical spinal cord,within the posterior central aspect at the C3-C4 levels.2. A single new supratentorial white matter lesion without any brain parenchymal contrast enhancement to indicate active cerebral lesions.3. Otherwise unchanged distribution of white matter lesions in apattern compatible with multiple sclerosis.  Patient had been off of her medication for 2 months when the MRI was done.  She returns for reevaluation 08/10/17 KWMs. [name] is a 53 year old left-handed black female with a history of multiple sclerosis. The patient has been on Tecfidera, she had a significant spinal cord exacerbation when she went off the drug in early 2018 with a  C3 spinal cord lesion. The patient has a history of bilateral carpal tunnel syndrome, she does have some residual numbness in the hands that could be either from carpal tunnel or from the spinal cord lesion. She occasionally will have some numbness in the feet. She has tolerated  the Tecfidera well. Over the last 2-1/2 weeks she has developed daily bifrontal headaches that are better in the morning and worse as the day goes on, the headaches are low-grade and do not prevent her from functioning. She denies any photophobia, phonophobia, or nausea or vomiting. The patient denies a neck stiffness or neck discomfort. The patient was having about 1 or 2 headaches a week prior to the onset of the daily headaches. The patient does report some mild gait instability, she denies issues controlling the bowels or the bladder. She denies any new visual complaints, or numbness or weakness. She returns to the office for an evaluation.   REVIEW OF SYSTEMS: Full 14 system review of systems performed and notable only for those listed, all others are neg:  Constitutional: neg  Cardiovascular: neg Ear/Nose/Throat: neg  Skin: neg Eyes: neg Respiratory: neg Gastroitestinal: neg  Hematology/Lymphatic: neg  Endocrine: neg Musculoskeletal left hip replacement in December, neck, back pain Allergy/Immunology: neg Neurological: Occasional numbness in the hands, history of carpal tunnel syndrome Psychiatric: neg Sleep : neg   ALLERGIES: Allergies  Allergen Reactions  . Erythromycin Shortness Of Breath    Chest pains also    HOME MEDICATIONS: Outpatient Medications Prior to Visit  Medication Sig Dispense Refill  . Dimethyl Fumarate (TECFIDERA) 240 MG CPDR Take 1 capsule (240 mg total) by mouth 2 (two) times daily. 180 capsule 1  . ibuprofen (ADVIL,MOTRIN) 200 MG tablet Take 400 mg by mouth every 6 (six) hours as needed for headache.     No facility-administered medications prior to visit.     PAST MEDICAL HISTORY: Past Medical History:  Diagnosis Date  . Multiple sclerosis (HCC)     PAST SURGICAL HISTORY: Past Surgical History:  Procedure Laterality Date  . CESAREAN SECTION      FAMILY HISTORY: Family History  Problem Relation Age of Onset  . Hypertension Mother   .  Post-traumatic stress disorder Father   . Hypertension Father   . Stroke Father   . Hypertension Brother   . Hypertension Brother     SOCIAL HISTORY: Social History   Socioeconomic History  . Marital status: Married    Spouse name: Primitivo Gauze  . Number of children: 4  . Years of education: 84  . Highest education level: Not on file  Occupational History  . Not on file  Social Needs  . Financial resource strain: Not on file  . Food insecurity:    Worry: Not on file    Inability: Not on file  . Transportation needs:    Medical: Not on file    Non-medical: Not on file  Tobacco Use  . Smoking status: Never Smoker  . Smokeless tobacco: Never Used  Substance and Sexual Activity  . Alcohol use: No  . Drug use: No  . Sexual activity: Not on file  Lifestyle  . Physical activity:    Days per week: Not on file    Minutes per session: Not on file  . Stress: Not on file  Relationships  . Social connections:    Talks on phone: Not on file    Gets together: Not on file    Attends religious service: Not on file  Active member of club or organization: Not on file    Attends meetings of clubs or organizations: Not on file    Relationship status: Not on file  . Intimate partner violence:    Fear of current or ex partner: Not on file    Emotionally abused: Not on file    Physically abused: Not on file    Forced sexual activity: Not on file  Other Topics Concern  . Not on file  Social History Narrative   Lives with husband   Caffeine use: Sometimes tea, soda sometimes    Left-handed     PHYSICAL EXAM  Vitals:   02/13/19 1502  BP: (!) 143/81  Pulse: 82  Weight: 202 lb 6.4 oz (91.8 kg)  Height: 5\' 4"  (1.626 m)   Body mass index is 34.74 kg/m.  Generalized: Well developed, obese female in no acute distress  Head: normocephalic and atraumatic,. Oropharynx benign  Neck: Supple,  Paraspinals tender to palpation Musculoskeletal: No deformity   Neurological examination    Mentation: Alert oriented to time, place, history taking. Attention span and concentration appropriate. Recent and remote memory intact.  Follows all commands speech and language fluent.   Cranial nerve II-XII:  Fundoscopic exam reveals sharp disc margins.Pupils were equal round reactive to light extraocular movements were full, visual field were full on confrontational test. Facial sensation and strength were normal. hearing was intact to finger rubbing bilaterally. Uvula tongue midline. head turning and shoulder shrug were normal and symmetric.Tongue protrusion into cheek strength was normal. Motor: normal bulk and tone, full strength in the BUE, BLE,  Sensory: normal and symmetric to light touch, pinprick, and  Vibration, in the upper and lower extremities Coordination: finger-nose-finger, heel-to-shin bilaterally, no dysmetria, no tremor Reflexes: Symmetric upper and lower plantar responses were flexor bilaterally. Gait and Station: Rising up from seated position without assistance, normal stance,  moderate stride, good arm swing, smooth turning, able to perform tiptoe, and heel walking without difficulty. Tandem gait is steady  DIAGNOSTIC DATA (LABS, IMAGING, TESTING) - I reviewed patient records, labs, notes, testing and imaging myself where available.  Lab Results  Component Value Date   WBC 5.7 09/18/2018   HGB 11.6 09/18/2018   HCT 35.4 09/18/2018   MCV 89 09/18/2018   PLT 302 09/18/2018      Component Value Date/Time   NA 142 09/18/2018 1156   K 4.2 09/18/2018 1156   CL 102 09/18/2018 1156   CO2 24 09/18/2018 1156   GLUCOSE 81 09/18/2018 1156   GLUCOSE 108 (H) 02/28/2017 1618   BUN 15 09/18/2018 1156   CREATININE 0.73 09/18/2018 1156   CALCIUM 9.4 09/18/2018 1156   PROT 6.5 09/18/2018 1156   ALBUMIN 4.3 09/18/2018 1156   AST 13 09/18/2018 1156   ALT 12 09/18/2018 1156   ALKPHOS 60 09/18/2018 1156   BILITOT <0.2 09/18/2018 1156   GFRNONAA 96 09/18/2018 1156   GFRAA  111 09/18/2018 1156     ASSESSMENT AND PLAN  53 y.o. year old female  has a past medical history of Multiple sclerosis (HCC). here to follow-up for her relapsing remitting multiple sclerosis and history of headaches.  She also has a history of carpal tunnel syndrome.  The patient is on tecfidera and has done well with the medication without side effects.  She had recent left hip surgery and complains of more back and neck pain.  Her pain sounds more positional  in the way that she is laying in the bed  at night.  She has arthritis.  MRI of the brain 09/19/2018 without change from previous in 2018.    PLAN: Continue tecfidera  twice daily  We will check labs today to monitor adverse effects of tecfidera CBC and CMP Recommend routine exercise at least 30 minutes daily  Nortriptyline 10mg  take as directed for neck and back pain Use heat to back and neck Given some neck exercises to do follow up in 4 months if she continues to have neck pain we will repeat MRI of the cervical spine I spent 25 minutes in total face to face time with the patient more than 50% of which was spent counseling and coordination of care, reviewing test results reviewing medications and discussing and reviewing the diagnosis of multiple sclerosis and further we also.  Also discussed neck and back pain and additional questions were answered.   Nilda Riggs, [facility], [facility], APRN  [facility] [address], [address] [place], [place] [address] [phone] Nilda Riggs, [facility], [facility], APRN

## 2019-02-13 ENCOUNTER — Ambulatory Visit: Payer: BLUE CROSS/BLUE SHIELD | Admitting: Nurse Practitioner

## 2019-02-13 ENCOUNTER — Encounter: Payer: Self-pay | Admitting: Nurse Practitioner

## 2019-02-13 VITALS — BP 143/81 | HR 82 | Ht 64.0 in | Wt 202.4 lb

## 2019-02-13 DIAGNOSIS — Z5181 Encounter for therapeutic drug level monitoring: Secondary | ICD-10-CM | POA: Diagnosis not present

## 2019-02-13 DIAGNOSIS — M542 Cervicalgia: Secondary | ICD-10-CM | POA: Diagnosis not present

## 2019-02-13 DIAGNOSIS — G35 Multiple sclerosis: Secondary | ICD-10-CM | POA: Diagnosis not present

## 2019-02-13 DIAGNOSIS — G4489 Other headache syndrome: Secondary | ICD-10-CM | POA: Diagnosis not present

## 2019-02-13 MED ORDER — NORTRIPTYLINE HCL 10 MG PO CAPS: 10.0000 mg | ORAL_CAPSULE | Freq: Every day | ORAL | 4 refills | Status: DC

## 2019-02-13 NOTE — Progress Notes (Signed)
I have read the note, and I agree with the clinical assessment and plan.  Dorlis Judice K Dail Meece   

## 2019-02-13 NOTE — Patient Instructions (Signed)
Continue tecfidera  twice daily  We will check labs today to monitor adverse effects of tecfidera CBC and CMP Recommend routine exercise at least 30 minutes daily  Nortriptyline 10mg  take as directed for neck and back pain Use heat to back and neck follow up in 4 months

## 2019-02-14 ENCOUNTER — Telehealth: Payer: Self-pay | Admitting: *Deleted

## 2019-02-14 LAB — CBC WITH DIFFERENTIAL/PLATELET
Basophils Absolute: 0 10*3/uL (ref 0.0–0.2)
Basos: 1 %
EOS (ABSOLUTE): 0.1 10*3/uL (ref 0.0–0.4)
Eos: 2 %
Hematocrit: 34.4 % (ref 34.0–46.6)
Hemoglobin: 11.3 g/dL (ref 11.1–15.9)
Immature Grans (Abs): 0 10*3/uL (ref 0.0–0.1)
Immature Granulocytes: 0 %
Lymphocytes Absolute: 1.9 10*3/uL (ref 0.7–3.1)
Lymphs: 31 %
MCH: 28.6 pg (ref 26.6–33.0)
MCHC: 32.8 g/dL (ref 31.5–35.7)
MCV: 87 fL (ref 79–97)
Monocytes Absolute: 0.6 10*3/uL (ref 0.1–0.9)
Monocytes: 10 %
Neutrophils Absolute: 3.3 10*3/uL (ref 1.4–7.0)
Neutrophils: 56 %
Platelets: 360 10*3/uL (ref 150–450)
RBC: 3.95 x10E6/uL (ref 3.77–5.28)
RDW: 12.9 % (ref 11.7–15.4)
WBC: 5.9 10*3/uL (ref 3.4–10.8)

## 2019-02-14 LAB — COMPREHENSIVE METABOLIC PANEL
ALT: 16 IU/L (ref 0–32)
AST: 16 IU/L (ref 0–40)
Albumin/Globulin Ratio: 1.8 (ref 1.2–2.2)
Albumin: 4.4 g/dL (ref 3.8–4.8)
Alkaline Phosphatase: 69 IU/L (ref 39–117)
BUN/Creatinine Ratio: 14 (ref 9–23)
BUN: 10 mg/dL (ref 6–24)
Bilirubin Total: 0.2 mg/dL (ref 0.0–1.2)
CO2: 25 mmol/L (ref 20–29)
Calcium: 9.2 mg/dL (ref 8.7–10.2)
Chloride: 100 mmol/L (ref 96–106)
Creatinine, Ser: 0.73 mg/dL (ref 0.57–1.00)
GFR calc Af Amer: 111 mL/min/{1.73_m2} (ref >59)
GFR calc non Af Amer: 96 mL/min/{1.73_m2} (ref >59)
Globulin, Total: 2.5 g/dL (ref 1.5–4.5)
Glucose: 73 mg/dL (ref 65–99)
Potassium: 4.3 mmol/L (ref 3.5–5.2)
Sodium: 139 mmol/L (ref 134–144)
Total Protein: 6.9 g/dL (ref 6.0–8.5)

## 2019-02-14 NOTE — Telephone Encounter (Signed)
-----   Message from Nilda Riggs, NP sent at 02/14/2019  7:53 AM EST ----- Labs stable please call the patient

## 2019-02-14 NOTE — Telephone Encounter (Signed)
Spoke to pt and relayed that her lab results are normal / stable.  She verbalized understanding.

## 2019-05-17 ENCOUNTER — Other Ambulatory Visit: Payer: Self-pay | Admitting: *Deleted

## 2019-05-17 MED ORDER — DIMETHYL FUMARATE 240 MG PO CPDR: 240.0000 mg | DELAYED_RELEASE_CAPSULE | Freq: Two times a day (BID) | ORAL | 1 refills | Status: DC

## 2019-06-14 ENCOUNTER — Telehealth: Payer: Self-pay

## 2019-06-14 NOTE — Telephone Encounter (Signed)
Spoke with the patient and they have given verbal consent to file insurance and to do a mychart video visit. Mychart video link has been sent to the patient.   

## 2019-06-17 NOTE — Progress Notes (Signed)
Virtual Visit via Video Note  I connected with Geradine GirtKim D Crays on 06/18/19 at  3:15 PM EDT by a video enabled telemedicine application and verified that I am speaking with the correct person using two identifiers.  Location: Patient: At her home  Provider: In the office    I discussed the limitations of evaluation and management by telemedicine and the availability of in person appointments. The patient expressed understanding and agreed to proceed.  History of Present Illness: 06/18/2019 SS: Ms. [name] is a 53 year old female with history of relapsing remitting multiple sclerosis and history of headaches.  She is here for follow-up regarding reports of neck pain.  She reports some neck pain that developed in 2019, worsened after she had hip surgery in December 2019. She was started on nortriptyline 10 mg at bedtime for neck and back pain, but she never started it.  She is taking Tecfidera for her multiple sclerosis.  She had MRI of the brain in 2019 showing left thalamic and right temporal chronic demyelinating plaques, no new plaques seen compared to 2018.  In 2018 she had a significant spinal cord exacerbation when she went off Tecfidera with a C3 spinal cord lesion.  She does have history of bilateral carpal tunnel syndrome, some residual numbness in her hands that could be from carpal tunnel or from spinal cord lesion.   She reports the neck pain is most bothersome at night while in the bed.  When she is lying on either side, she reports neck discomfort, numbness/tingling down both arms.  She does not have the symptoms if she lies flat.  She denies any symptoms during the day.  The neck discomfort has been significant prior to a few weeks ago.  She denies any numbness or weakness in her legs.  She reports neck pain when rotating her head to the left.  She reports her primary care doctor diagnosed her with a pinched nerve in her neck some time ago.   UPDATE 2/18/2020CM Ms. [name], 53 year old  female returns for follow-up with history of relapsing remitting multiple sclerosis and history of headaches in the past however she has never been on preventive medication.  She had left hip replacement in December.  She has concluded her rehab.  She states since her surgery because she had to sleep on her back her neck began bothering her and she also has some pain in her arms and neck and upper back.  It may be positional.  She claims she is only been taking ibuprofen periodically.  She has a history of carpal tunnel syndrome.  She is currently on tecfidera for her multiple sclerosis relapsing remitting.  Most recent MRI of the brain 09/19/18 MRI brain (with and without) demonstrating: - Left thalamic and right temporal chronic demyelinating plaques. Scattered subtle pericallosal T2 hyperintensities also noted. - No abnormal lesions are seen on post contrast views.  - No new plaques compared to MRI on 03/17/17.  She denies any visual changes any increased spasticity any speech or swallowing difficulty bowel or bladder issues.  Since her surgery she does not sleep well at night because she cannot get in a comfortable position.  She returns for reevaluation   Observations/Objective: Alert, answers questions appropriately, smiling, speech is clear and concise, facial symmetry noted, neck rotation to the left results in some neck discomfort, otherwise full range of motion, no arm drift, gait appears intact, but ambulation is limited due to small room  Assessment and Plan: 1. MS 2.  Chronic neck pain  The purpose of today's visit was to follow-up on neck pain she had reported at her last visit in February 2020.  She was started on nortriptyline, however she never started the medication.  She continues to complain of some neck discomfort, numbness/tingling down both arms when lying on either side in the bed.  For this reason, we will go ahead with MRI of her cervical spine with and without. She will come  into the office to have BMP lab work done prior to scan.  She reports neck pain since last year, worsened after hip surgery in December 2019.  She had MRI of her cervical spine in March 2018, showed a 2 cm active demyelinating lesion at C3-C4. She has repeat MRI of the brain in 2019, didn't show any change from 2018.   Follow Up Instructions: 4 months 10/01/2019 9:45 am   I discussed the assessment and treatment plan with the patient. The patient was provided an opportunity to ask questions and all were answered. The patient agreed with the plan and demonstrated an understanding of the instructions.   The patient was advised to call back or seek an in-person evaluation if the symptoms worsen or if the condition fails to improve as anticipated.  I provided 15 minutes of non-face-to-face time during this encounter.  Evangeline Dakin, DNP  [facility] [address] [place], [place] [address] [phone]

## 2019-06-18 ENCOUNTER — Telehealth (INDEPENDENT_AMBULATORY_CARE_PROVIDER_SITE_OTHER): Payer: BC Managed Care – PPO | Admitting: Neurology

## 2019-06-18 ENCOUNTER — Encounter: Payer: Self-pay | Admitting: Neurology

## 2019-06-18 DIAGNOSIS — M542 Cervicalgia: Secondary | ICD-10-CM | POA: Diagnosis not present

## 2019-06-18 NOTE — Progress Notes (Signed)
I have read the note, and I agree with the clinical assessment and plan.  Marleen Moret K Caterine Mcmeans   

## 2019-06-20 ENCOUNTER — Telehealth: Payer: Self-pay | Admitting: Neurology

## 2019-06-20 NOTE — Telephone Encounter (Signed)
Patient called to r/s her appt to 06/26/19.

## 2019-06-20 NOTE — Telephone Encounter (Signed)
no to the covid-19 questions MR Cervical spine w/wo contrast [facility]: [id number] (exp. 06/20/19 to 12/16/19). Patient is scheduled at [facility] for 06/27/19

## 2019-06-26 ENCOUNTER — Other Ambulatory Visit: Payer: Self-pay

## 2019-06-26 ENCOUNTER — Ambulatory Visit (INDEPENDENT_AMBULATORY_CARE_PROVIDER_SITE_OTHER): Payer: BC Managed Care – PPO

## 2019-06-26 DIAGNOSIS — M542 Cervicalgia: Secondary | ICD-10-CM

## 2019-06-26 MED ORDER — GADOBENATE DIMEGLUMINE 529 MG/ML IV SOLN
18.0000 mL | Freq: Once | INTRAVENOUS | Status: AC | PRN
  Administered 2019-06-26: 18 mL via INTRAVENOUS

## 2019-06-27 ENCOUNTER — Other Ambulatory Visit: Payer: BC Managed Care – PPO

## 2019-06-28 ENCOUNTER — Telehealth: Payer: Self-pay | Admitting: *Deleted

## 2019-06-28 NOTE — Telephone Encounter (Signed)
-----   Message from Suzzanne Cloud, NP sent at 06/28/2019  7:47 AM EDT ----- Please call the patient for MRI of cervical spine. The plaque C3 plaque is actually smaller from prior. This could be chronic causing the tingling in her arms. If this continues, we could get EMG nerve evaluation.   IMPRESSION:  MRI cervical spine (with and without) demonstrating: - Chronic demyelinating plaque at C3 level centrally. No active plaques. - Mild disc bulging at C4-5 to C7-T1. No spinal stenosis or foraminal narrowing. - Compared to MRI on 03/17/17, the C3 plaque has decreased in size.

## 2019-06-28 NOTE — Telephone Encounter (Signed)
Spoke to pt and relayed per SS/NP MRI of cervical spine. The plaque C3 plaque is actually smaller from prior. This could be chronic causing the tingling in her arms. If this continues, we could get EMG nerve evaluation.  Also she had question, re:  Mild disc bulging at C4-5 to C7-T1. No spinal stenosis or foraminal narrowing. She verbalized understanding, no other questions.

## 2019-09-30 NOTE — Progress Notes (Signed)
PATIENT: [name] DOB: 12-09-1966  REASON FOR VISIT: follow up HISTORY FROM: patient  HISTORY OF PRESENT ILLNESS: Today 10/01/19  Ms. Morrie Sheldonshley is a 53 year old female with history of relapsing remitting multiple sclerosis and history of headaches.  She has also complained of neck pain since 2019, worsened after she had hip surgery in December 2019.  She has been started on nortriptyline, but she never started the medication.  She is taking Tecfidera for multiple sclerosis.  In 2018 she had a significant spinal cord exacerbation when she went off Tecfidera with a C3 spinal cord lesion.  She does have history of bilateral carpal tunnel, some of her residual numbness in her hands could be from carpal tunnel or from the spinal cord lesion.  She had MRI of her cervical spine in July 2020, showed that her C3 plaque is actually smaller from prior.  MRI of the brain was done in September 2019, showed left thalamic and right temporal chronic demyelinating plaques no new plaques compared to 2018.  Today, she indicates she is no longer having any neck pain.  She reports she is tolerating Tecfidera, taking it on a fairly consistent basis.  She does report some intermittent pain to her right flank, that is dull.  She denies a hugging sensation.  She denies any urinary symptoms.  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  She says she knows she needs to lose weight.  She has been walking.  She continues to report numbness in both hands if she lies on either side.  She denies any falls.  She denies any changes to her bowel or bladder.  She does report over the last several months she has developed a cyst to her right wrist.  She presents today for follow-up unaccompanied.  HISTORY 06/18/2019 SS: Ms. Morrie Sheldonshley is a 53 year old female with history of relapsing remitting multiple sclerosis and history of headaches.  She is here for follow-up regarding reports of neck pain.  She reports some neck pain that developed  in 2019, worsened after she had hip surgery in December 2019. She was started on nortriptyline 10 mg at bedtime for neck and back pain, but she never started it.  She is taking Tecfidera for her multiple sclerosis.  She had MRI of the brain in 2019 showing left thalamic and right temporal chronic demyelinating plaques, no new plaques seen compared to 2018.  In 2018 she had a significant spinal cord exacerbation when she went off Tecfidera with a C3 spinal cord lesion.  She does have history of bilateral carpal tunnel syndrome, some residual numbness in her hands that could be from carpal tunnel or from spinal cord lesion.   She reports the neck pain is most bothersome at night while in the bed.  When she is lying on either side, she reports neck discomfort, numbness/tingling down both arms.  She does not have the symptoms if she lies flat.  She denies any symptoms during the day.  The neck discomfort has been significant prior to a few weeks ago.  She denies any numbness or weakness in her legs.  She reports neck pain when rotating her head to the left.  She reports her primary care doctor diagnosed her with a pinched nerve in her neck some time ago  REVIEW OF SYSTEMS: Out of a complete 14 system review of symptoms, the patient complains only of the following symptoms, and all other reviewed systems are negative.  Numbness  ALLERGIES: Allergies  Allergen Reactions  .  Erythromycin Shortness Of Breath    Chest pains also    HOME MEDICATIONS: Outpatient Medications Prior to Visit  Medication Sig Dispense Refill  . ibuprofen (ADVIL,MOTRIN) 200 MG tablet Take 400 mg by mouth every 6 (six) hours as needed for headache.    . Dimethyl Fumarate (TECFIDERA) 240 MG CPDR Take 1 capsule (240 mg total) by mouth 2 (two) times daily. 180 capsule 1  . nortriptyline (PAMELOR) 10 MG capsule Take 1 capsule (10 mg total) by mouth at bedtime. 1 po hs for 1 week then increase to 2 tabs at hs for 1 week then 3 tabs at  hs (Patient not taking: Reported on 10/01/2019) 90 capsule 4   No facility-administered medications prior to visit.     PAST MEDICAL HISTORY: Past Medical History:  Diagnosis Date  . Multiple sclerosis (HCC)     PAST SURGICAL HISTORY: Past Surgical History:  Procedure Laterality Date  . CESAREAN SECTION      FAMILY HISTORY: Family History  Problem Relation Age of Onset  . Hypertension Mother   . Post-traumatic stress disorder Father   . Hypertension Father   . Stroke Father   . Hypertension Brother   . Hypertension Brother     SOCIAL HISTORY: Social History   Socioeconomic History  . Marital status: Married    Spouse name: Primitivo GauzeFletcher  . Number of children: 4  . Years of education: 6913  . Highest education level: Not on file  Occupational History  . Not on file  Social Needs  . Financial resource strain: Not on file  . Food insecurity    Worry: Not on file    Inability: Not on file  . Transportation needs    Medical: Not on file    Non-medical: Not on file  Tobacco Use  . Smoking status: Never Smoker  . Smokeless tobacco: Never Used  Substance and Sexual Activity  . Alcohol use: No  . Drug use: No  . Sexual activity: Not on file  Lifestyle  . Physical activity    Days per week: Not on file    Minutes per session: Not on file  . Stress: Not on file  Relationships  . Social Musicianconnections    Talks on phone: Not on file    Gets together: Not on file    Attends religious service: Not on file    Active member of club or organization: Not on file    Attends meetings of clubs or organizations: Not on file    Relationship status: Not on file  . Intimate partner violence    Fear of current or ex partner: Not on file    Emotionally abused: Not on file    Physically abused: Not on file    Forced sexual activity: Not on file  Other Topics Concern  . Not on file  Social History Narrative   Lives with husband   Caffeine use: Sometimes tea, soda sometimes     Left-handed      PHYSICAL EXAM  Vitals:   10/01/19 0951  BP: 121/71  Pulse: 86  Temp: 98.2 F (36.8 C)  TempSrc: Oral  Weight: 202 lb 9.6 oz (91.9 kg)  Height: 5\' 4"  (1.626 m)   Body mass index is 34.78 kg/m.  Generalized: Well developed, in no acute distress, quarter sized cyst to right wrist noted, firm, smooth, round, rubbery. No right CVA tenderness noted.  Neurological examination  Mentation: Alert oriented to time, place, history taking. Follows all commands speech  and language fluent Cranial nerve II-XII: Pupils were equal round reactive to light. Extraocular movements were full, visual field were full on confrontational test. Facial sensation and strength were normal. Head turning and shoulder shrug  were normal and symmetric. Motor: The motor testing reveals 5 over 5 strength of all 4 extremities. Good symmetric motor tone is noted throughout.  Sensory: Sensory testing is intact to soft touch on all 4 extremities. No evidence of extinction is noted.  Coordination: Cerebellar testing reveals good finger-nose-finger and heel-to-shin bilaterally.  Gait and station: Gait is normal. Tandem gait is normal. Romberg is negative. No drift is seen.  Reflexes: Deep tendon reflexes are symmetric and normal bilaterally.   DIAGNOSTIC DATA (LABS, IMAGING, TESTING) - I reviewed patient records, labs, notes, testing and imaging myself where available.  Lab Results  Component Value Date   WBC 5.9 02/13/2019   HGB 11.3 02/13/2019   HCT 34.4 02/13/2019   MCV 87 02/13/2019   PLT 360 02/13/2019      Component Value Date/Time   NA 139 02/13/2019 1547   K 4.3 02/13/2019 1547   CL 100 02/13/2019 1547   CO2 25 02/13/2019 1547   GLUCOSE 73 02/13/2019 1547   GLUCOSE 108 (H) 02/28/2017 1618   BUN 10 02/13/2019 1547   CREATININE 0.73 02/13/2019 1547   CALCIUM 9.2 02/13/2019 1547   PROT 6.9 02/13/2019 1547   ALBUMIN 4.4 02/13/2019 1547   AST 16 02/13/2019 1547   ALT 16 02/13/2019  1547   ALKPHOS 69 02/13/2019 1547   BILITOT 0.2 02/13/2019 1547   GFRNONAA 96 02/13/2019 1547   GFRAA 111 02/13/2019 1547   Lab Results  Component Value Date   CHOL  04/21/2011    176        ATP III CLASSIFICATION:  <200     mg/dL   Desirable  200-239  mg/dL   Borderline High  >=240    mg/dL   High          HDL 44 04/21/2011   LDLCALC (H) 04/21/2011    115        Total Cholesterol/HDL:CHD Risk Coronary Heart Disease Risk Table                     Men   Women  1/2 Average Risk   3.4   3.3  Average Risk       5.0   4.4  2 X Average Risk   9.6   7.1  3 X Average Risk  23.4   11.0        Use the calculated Patient Ratio above and the CHD Risk Table to determine the patient's CHD Risk.        ATP III CLASSIFICATION (LDL):  <100     mg/dL   Optimal  100-129  mg/dL   Near or Above                    Optimal  130-159  mg/dL   Borderline  160-189  mg/dL   High  >190     mg/dL   Very High   TRIG 87 04/21/2011   CHOLHDL 4.0 04/21/2011   Lab Results  Component Value Date   HGBA1C (H) 04/20/2011    5.9 (NOTE)  According to the ADA Clinical Practice Recommendations for 2011, when HbA1c is used as a screening test:   >=6.5%   Diagnostic of Diabetes Mellitus           (if abnormal result  is confirmed)  5.7-6.4%   Increased risk of developing Diabetes Mellitus  References:Diagnosis and Classification of Diabetes Mellitus,Diabetes Care,2011,34(Suppl 1):S62-S69 and Standards of Medical Care in         Diabetes - 2011,Diabetes Care,2011,34  (Suppl 1):S11-S61.   Lab Results  Component Value Date   VITAMINB12 943 (H) 04/21/2011   Lab Results  Component Value Date   TSH 0.684 04/21/2011    ASSESSMENT AND PLAN 53 y.o. year old female  has a past medical history of Multiple sclerosis (HCC). here with:  1.  Relapsing remitting multiple sclerosis -Overall has continued to do well -Continue Tecfidera, encouraged  daily compliance -I will check routine lab work today including a CBC and a CMP for monitor for adverse effect of Tecfidera -Last MRI of the brain 09/19/2018 showed left thalamic and right temporal chronic demyelinating plaques, no new plaques compared to prior MRI 03/17/2017 -Suffered MS exacerbation in 2018, while off Tecfidera -Can consider recheck on MRI of the brain at next visit -Will follow-up in 6 months or sooner if needed  2.  Neck pain -She reports this is resolved, is no longer an issue, never started nortriptyline -MRI of cervical spine was done 06/27/2019 showed, chronic demyelinating plaque at C3, compared to prior MRI 03/17/2017, the plaque has decreased in size  3.  Right flank pain -Describes intermittent right flank pain, dull sensation for the last several weeks, denies urinary symptoms -Denies sensation of "MS hug", has had this in the past  -I will check urinalysis today, routine lab work  4. Cyst -Appears she may have ganglion cyst to right wrist -Will monitor overtime, is not painful or causing problem now  I spent 25 minutes with the patient. 50% of this time was spent discussing her plan of care.   Margie Ege, AGNP-C, DNP 10/01/2019, 11:00 AM [facility] [address], [address] [place], [place] [address] [phone]

## 2019-10-01 ENCOUNTER — Other Ambulatory Visit: Payer: Self-pay

## 2019-10-01 ENCOUNTER — Ambulatory Visit: Payer: BC Managed Care – PPO | Admitting: Neurology

## 2019-10-01 ENCOUNTER — Encounter: Payer: Self-pay | Admitting: Neurology

## 2019-10-01 VITALS — BP 121/71 | HR 86 | Temp 98.2°F | Ht 64.0 in | Wt 202.6 lb

## 2019-10-01 DIAGNOSIS — M542 Cervicalgia: Secondary | ICD-10-CM | POA: Diagnosis not present

## 2019-10-01 DIAGNOSIS — G35 Multiple sclerosis: Secondary | ICD-10-CM

## 2019-10-01 DIAGNOSIS — R109 Unspecified abdominal pain: Secondary | ICD-10-CM | POA: Diagnosis not present

## 2019-10-01 MED ORDER — DIMETHYL FUMARATE 240 MG PO CPDR: 240.0000 mg | DELAYED_RELEASE_CAPSULE | Freq: Two times a day (BID) | ORAL | 1 refills | Status: DC

## 2019-10-01 NOTE — Progress Notes (Signed)
I have read the note, and I agree with the clinical assessment and plan.  Charles K Willis   

## 2019-10-01 NOTE — Patient Instructions (Signed)
1. Continue Tecfidera 2. I will check CBC, CMP 3. Urine test today for right flank

## 2019-10-02 LAB — CBC WITH DIFFERENTIAL/PLATELET
Basophils Absolute: 0 10*3/uL (ref 0.0–0.2)
Basos: 0 %
EOS (ABSOLUTE): 0.1 10*3/uL (ref 0.0–0.4)
Eos: 2 %
Hematocrit: 38.3 % (ref 34.0–46.6)
Hemoglobin: 11.9 g/dL (ref 11.1–15.9)
Immature Grans (Abs): 0 10*3/uL (ref 0.0–0.1)
Immature Granulocytes: 0 %
Lymphocytes Absolute: 1.8 10*3/uL (ref 0.7–3.1)
Lymphs: 30 %
MCH: 28.3 pg (ref 26.6–33.0)
MCHC: 31.1 g/dL — ABNORMAL LOW (ref 31.5–35.7)
MCV: 91 fL (ref 79–97)
Monocytes Absolute: 0.5 10*3/uL (ref 0.1–0.9)
Monocytes: 9 %
Neutrophils Absolute: 3.5 10*3/uL (ref 1.4–7.0)
Neutrophils: 59 %
Platelets: 318 10*3/uL (ref 150–450)
RBC: 4.21 x10E6/uL (ref 3.77–5.28)
RDW: 13.4 % (ref 11.7–15.4)
WBC: 5.9 10*3/uL (ref 3.4–10.8)

## 2019-10-02 LAB — URINALYSIS, ROUTINE W REFLEX MICROSCOPIC
Bilirubin, UA: NEGATIVE
Glucose, UA: NEGATIVE
Ketones, UA: NEGATIVE
Leukocytes,UA: NEGATIVE
Nitrite, UA: NEGATIVE
Protein,UA: NEGATIVE
RBC, UA: NEGATIVE
Specific Gravity, UA: 1.023 (ref 1.005–1.030)
Urobilinogen, Ur: 0.2 mg/dL (ref 0.2–1.0)
pH, UA: 5.5 (ref 5.0–7.5)

## 2019-10-02 LAB — COMPREHENSIVE METABOLIC PANEL
ALT: 16 IU/L (ref 0–32)
AST: 16 IU/L (ref 0–40)
Albumin/Globulin Ratio: 2 (ref 1.2–2.2)
Albumin: 4.3 g/dL (ref 3.8–4.9)
Alkaline Phosphatase: 64 IU/L (ref 39–117)
BUN/Creatinine Ratio: 13 (ref 9–23)
BUN: 11 mg/dL (ref 6–24)
Bilirubin Total: 0.2 mg/dL (ref 0.0–1.2)
CO2: 21 mmol/L (ref 20–29)
Calcium: 9.2 mg/dL (ref 8.7–10.2)
Chloride: 104 mmol/L (ref 96–106)
Creatinine, Ser: 0.85 mg/dL (ref 0.57–1.00)
GFR calc Af Amer: 92 mL/min/{1.73_m2} (ref >59)
GFR calc non Af Amer: 80 mL/min/{1.73_m2} (ref >59)
Globulin, Total: 2.2 g/dL (ref 1.5–4.5)
Glucose: 79 mg/dL (ref 65–99)
Potassium: 4.4 mmol/L (ref 3.5–5.2)
Sodium: 137 mmol/L (ref 134–144)
Total Protein: 6.5 g/dL (ref 6.0–8.5)

## 2019-10-03 ENCOUNTER — Telehealth: Payer: Self-pay | Admitting: *Deleted

## 2019-10-03 NOTE — Telephone Encounter (Signed)
I called pt and relayed that her lab results unremarkable.  Urnine and LFT normal.  She verbalized understanding.

## 2019-10-03 NOTE — Telephone Encounter (Signed)
-----   Message from Suzzanne Cloud, NP sent at 10/02/2019  8:27 AM EDT ----- Please call the patient. Labs are unremarkable. Urine sample was normal. Liver function was also normal.

## 2019-11-28 ENCOUNTER — Ambulatory Visit: Payer: BC Managed Care – PPO | Admitting: Neurology

## 2019-11-28 ENCOUNTER — Encounter: Payer: Self-pay | Admitting: Neurology

## 2019-11-28 ENCOUNTER — Other Ambulatory Visit: Payer: Self-pay

## 2019-11-28 VITALS — BP 122/69 | HR 82 | Temp 97.1°F | Ht 64.0 in | Wt 210.4 lb

## 2019-11-28 DIAGNOSIS — G514 Facial myokymia: Secondary | ICD-10-CM

## 2019-11-28 DIAGNOSIS — G35 Multiple sclerosis: Secondary | ICD-10-CM

## 2019-11-28 NOTE — Progress Notes (Signed)
PATIENT: [name] DOB: 21-Nov-1966  REASON FOR VISIT: follow up HISTORY FROM: patient  HISTORY OF PRESENT ILLNESS: Today 11/28/19  [name] is a 53 year old female with history of relapsing remitting multiple sclerosis and history of headaches.  She is taking Tecfidera.  She had MRI of the cervical spine in July 2020, showed that her C3 plaque is actually smaller from prior.  MRI of the brain was done in September 2019, showed left thalamic and right temporal chronic demyelinating plaques, no new plaques compared to 2018.  She presents today complaining of a new problem.  She reports since 11/23/2019, she has noticed spasms/trembling to her left upper lip, and around the left side of her mouth.  They have been intermittently throughout the day since the occurrence.  She denies any other symptoms such as numbness or weakness to arms or legs.  She denies any changes to her balance, walking, bowels or bladder, or vision.  She indicates she may miss a dose of Tecfidera intermittently, but none as of recent. She denies any recent stress or anything abnormal.  She presents today for evaluation.  HISTORY 10/01/2019 SS: [name] is a 53 year old female with history of relapsing remitting multiple sclerosis and history of headaches.  She has also complained of neck pain since 2019, worsened after she had hip surgery in December 2019.  She has been started on nortriptyline, but she never started the medication.  She is taking Tecfidera for multiple sclerosis.  In 2018 she had a significant spinal cord exacerbation when she went off Tecfidera with a C3 spinal cord lesion.  She does have history of bilateral carpal tunnel, some of her residual numbness in her hands could be from carpal tunnel or from the spinal cord lesion.  She had MRI of her cervical spine in July 2020, showed that her C3 plaque is actually smaller from prior.  MRI of the brain was done in September 2019, showed left thalamic and right  temporal chronic demyelinating plaques no new plaques compared to 2018.  Today, she indicates she is no longer having any neck pain.  She reports she is tolerating Tecfidera, taking it on a fairly consistent basis.  She does report some intermittent pain to her right flank, that is dull.  She denies a hugging sensation.  She denies any urinary symptoms.  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  She says she knows she needs to lose weight.  She has been walking.  She continues to report numbness in both hands if she lies on either side.  She denies any falls.  She denies any changes to her bowel or bladder.  She does report over the last several months she has developed a cyst to her right wrist.  She presents today for follow-up unaccompanied.  REVIEW OF SYSTEMS: Out of a complete 14 system review of symptoms, the patient complains only of the following symptoms, and all other reviewed systems are negative.  Muscle spasms  ALLERGIES: Allergies  Allergen Reactions  . Erythromycin Shortness Of Breath    Chest pains also    HOME MEDICATIONS: Outpatient Medications Prior to Visit  Medication Sig Dispense Refill  . Dimethyl Fumarate (TECFIDERA) 240 MG CPDR Take 1 capsule (240 mg total) by mouth 2 (two) times daily. 180 capsule 1  . ibuprofen (ADVIL,MOTRIN) 200 MG tablet Take 400 mg by mouth every 6 (six) hours as needed for headache.     No facility-administered medications prior to visit.  PAST MEDICAL HISTORY: Past Medical History:  Diagnosis Date  . Multiple sclerosis (HCC)     PAST SURGICAL HISTORY: Past Surgical History:  Procedure Laterality Date  . CESAREAN SECTION      FAMILY HISTORY: Family History  Problem Relation Age of Onset  . Hypertension Mother   . Post-traumatic stress disorder Father   . Hypertension Father   . Stroke Father   . Hypertension Brother   . Hypertension Brother     SOCIAL HISTORY: Social History   Socioeconomic History  . Marital  status: Married    Spouse name: Primitivo GauzeFletcher  . Number of children: 4  . Years of education: 5313  . Highest education level: Not on file  Occupational History  . Not on file  Social Needs  . Financial resource strain: Not on file  . Food insecurity    Worry: Not on file    Inability: Not on file  . Transportation needs    Medical: Not on file    Non-medical: Not on file  Tobacco Use  . Smoking status: Never Smoker  . Smokeless tobacco: Never Used  Substance and Sexual Activity  . Alcohol use: No  . Drug use: No  . Sexual activity: Not on file  Lifestyle  . Physical activity    Days per week: Not on file    Minutes per session: Not on file  . Stress: Not on file  Relationships  . Social Musicianconnections    Talks on phone: Not on file    Gets together: Not on file    Attends religious service: Not on file    Active member of club or organization: Not on file    Attends meetings of clubs or organizations: Not on file    Relationship status: Not on file  . Intimate partner violence    Fear of current or ex partner: Not on file    Emotionally abused: Not on file    Physically abused: Not on file    Forced sexual activity: Not on file  Other Topics Concern  . Not on file  Social History Narrative   Lives with husband   Caffeine use: Sometimes tea, soda sometimes    Left-handed   PHYSICAL EXAM  Vitals:   11/28/19 0906  BP: 122/69  Pulse: 82  Temp: (!) 97.1 F (36.2 C)  TempSrc: Oral  Weight: 210 lb 6.4 oz (95.4 kg)  Height: 5\' 4"  (1.626 m)   Body mass index is 36.12 kg/m.  Generalized: Well developed, in no acute distress   Neurological examination  Mentation: Alert oriented to time, place, history taking. Follows all commands speech and language fluent Cranial nerve II-XII: Pupils were equal round reactive to light. Extraocular movements were full, visual field were full on confrontational test. Facial sensation and strength were normal.  Head turning and shoulder  shrug  were normal and symmetric.  Muscle twitching noted to left upper lip intermittently. Motor: The motor testing reveals 5 over 5 strength of all 4 extremities. Good symmetric motor tone is noted throughout.  Sensory: Sensory testing is intact to soft touch on all 4 extremities. No evidence of extinction is noted.  Coordination: Cerebellar testing reveals good finger-nose-finger and heel-to-shin bilaterally.  Gait and station: Gait is normal. Tandem gait is normal. Romberg is negative. No drift is seen.  Reflexes: Deep tendon reflexes are symmetric and normal bilaterally.   DIAGNOSTIC DATA (LABS, IMAGING, TESTING) - I reviewed patient records, labs, notes, testing and imaging myself where  available.  Lab Results  Component Value Date   WBC 5.9 10/01/2019   HGB 11.9 10/01/2019   HCT 38.3 10/01/2019   MCV 91 10/01/2019   PLT 318 10/01/2019      Component Value Date/Time   NA 137 10/01/2019 1025   K 4.4 10/01/2019 1025   CL 104 10/01/2019 1025   CO2 21 10/01/2019 1025   GLUCOSE 79 10/01/2019 1025   GLUCOSE 108 (H) 02/28/2017 1618   BUN 11 10/01/2019 1025   CREATININE 0.85 10/01/2019 1025   CALCIUM 9.2 10/01/2019 1025   PROT 6.5 10/01/2019 1025   ALBUMIN 4.3 10/01/2019 1025   AST 16 10/01/2019 1025   ALT 16 10/01/2019 1025   ALKPHOS 64 10/01/2019 1025   BILITOT 0.2 10/01/2019 1025   GFRNONAA 80 10/01/2019 1025   GFRAA 92 10/01/2019 1025   Lab Results  Component Value Date   CHOL  04/21/2011    176        ATP III CLASSIFICATION:  <200     mg/dL   Desirable  200-239  mg/dL   Borderline High  >=240    mg/dL   High          HDL 44 04/21/2011   LDLCALC (H) 04/21/2011    115        Total Cholesterol/HDL:CHD Risk Coronary Heart Disease Risk Table                     Men   Women  1/2 Average Risk   3.4   3.3  Average Risk       5.0   4.4  2 X Average Risk   9.6   7.1  3 X Average Risk  23.4   11.0        Use the calculated Patient Ratio above and the CHD Risk Table  to determine the patient's CHD Risk.        ATP III CLASSIFICATION (LDL):  <100     mg/dL   Optimal  100-129  mg/dL   Near or Above                    Optimal  130-159  mg/dL   Borderline  160-189  mg/dL   High  >190     mg/dL   Very High   TRIG 87 04/21/2011   CHOLHDL 4.0 04/21/2011   Lab Results  Component Value Date   HGBA1C (H) 04/20/2011    5.9 (NOTE)                                                                       According to the ADA Clinical Practice Recommendations for 2011, when HbA1c is used as a screening test:   >=6.5%   Diagnostic of Diabetes Mellitus           (if abnormal result  is confirmed)  5.7-6.4%   Increased risk of developing Diabetes Mellitus  References:Diagnosis and Classification of Diabetes Mellitus,Diabetes HERD,4081,44(YJEHU 1):S62-S69 and Standards of Medical Care in         Diabetes - 2011,Diabetes Care,2011,34  (Suppl 1):S11-S61.   Lab Results  Component Value Date   [id number] 858 (H) 04/21/2011   Lab Results  Component Value Date   TSH 0.684 04/21/2011    ASSESSMENT AND PLAN 53 y.o. year old female  has a past medical history of Multiple sclerosis (HCC). here with:  1.  Relapsing remitting multiple sclerosis 2.  Facial twitching, on the left side of mouth   Today she complains of a new problem of muscle twitching since 11/23/2019 to her left upper lip, and around the left side of her mouth.  The twitching has been intermittent.  She has never had this manifestation before. 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 I will order MRI of the brain with and without contrast.  I will check kidney function before contrast administration.  She will remain on Tecfidera.  She has a follow-up appointment with Dr. Anne Hahn in April 2021. Will adjust treatment plan accordingly if warranted after MRI. I did advise if her symptoms worsen or she develops any new symptoms she should let us know.  MRI of the brain 09/19/2018 MRI brain (with and without)  demonstrating: - Left thalamic and right temporal chronic demyelinating plaques.  Scattered subtle pericallosal T2 hyperintensities also noted. - No abnormal lesions are seen on post contrast views.   - No new plaques compared to MRI on 03/17/17.   I spent 25 minutes with the patient. 50% of this time was spent discussing her plan of care.   Margie Ege, AGNP-C, DNP 11/28/2019, 9:18 AM [facility] [address], [address] [place], [place] [address] [phone]

## 2019-11-28 NOTE — Patient Instructions (Signed)
I will order MRI of the brain   I will check kidney function   I will see you back in 3-4 months.

## 2019-11-29 LAB — BASIC METABOLIC PANEL WITH GFR
BUN/Creatinine Ratio: 13 (ref 9–23)
BUN: 10 mg/dL (ref 6–24)
CO2: 22 mmol/L (ref 20–29)
Calcium: 9 mg/dL (ref 8.7–10.2)
Chloride: 104 mmol/L (ref 96–106)
Creatinine, Ser: 0.79 mg/dL (ref 0.57–1.00)
GFR calc Af Amer: 100 mL/min/1.73
GFR calc non Af Amer: 87 mL/min/1.73
Glucose: 84 mg/dL (ref 65–99)
Potassium: 4.4 mmol/L (ref 3.5–5.2)
Sodium: 137 mmol/L (ref 134–144)

## 2019-12-01 NOTE — Progress Notes (Signed)
I have read the note, and I agree with the clinical assessment and plan.  Sophia Sperry K Mamoudou [place]   

## 2019-12-11 ENCOUNTER — Telehealth: Payer: Self-pay

## 2019-12-11 NOTE — Telephone Encounter (Signed)
I called and left a VM for the patient letting her know our office would not open until 10am tomorrow due to inclement weather.

## 2019-12-12 ENCOUNTER — Other Ambulatory Visit: Payer: BC Managed Care – PPO

## 2019-12-18 ENCOUNTER — Ambulatory Visit (INDEPENDENT_AMBULATORY_CARE_PROVIDER_SITE_OTHER): Payer: BC Managed Care – PPO

## 2019-12-18 ENCOUNTER — Other Ambulatory Visit: Payer: Self-pay

## 2019-12-18 DIAGNOSIS — G35 Multiple sclerosis: Secondary | ICD-10-CM | POA: Diagnosis not present

## 2019-12-18 MED ORDER — GADOBENATE DIMEGLUMINE 529 MG/ML IV SOLN
20.0000 mL | Freq: Once | INTRAVENOUS | Status: AC | PRN
  Administered 2019-12-18: 20 mL via INTRAVENOUS

## 2019-12-25 ENCOUNTER — Telehealth: Payer: Self-pay | Admitting: *Deleted

## 2019-12-25 NOTE — Telephone Encounter (Signed)
-----   Message from Suzzanne Cloud, NP sent at 12/24/2019  4:08 PM EST ----- Please call patient and see how she is doing? Is she still having the facial spasms? MRI of the brain was stable, no change from prior in September 2019.  IMPRESSION:   MRI brain (with and without) demonstrating: - Stable left thalamic and minimal peri-callosal chronic demyelinating plaques. No abnormal lesions on post-contrast views.  - No change from MRI on 09/19/18.

## 2019-12-25 NOTE — Telephone Encounter (Signed)
I called pt and relayed that the MRI results of her brain was stable, no change from 09-19-2018. She stated the facial spasms subsided about 2 wks ago.  She verbalized understanding of her MRI results.

## 2020-01-01 NOTE — Telephone Encounter (Signed)
I would continue with medical therapy with Tecfidera for now, particularly if the symptoms have resolved, the patient likely had some new event that was not apparent on MRI.

## 2020-01-24 ENCOUNTER — Telehealth: Payer: Self-pay | Admitting: Neurology

## 2020-01-24 MED ORDER — DIMETHYL FUMARATE 240 MG PO CPDR: 240.0000 mg | DELAYED_RELEASE_CAPSULE | Freq: Two times a day (BID) | ORAL | 3 refills | Status: DC

## 2020-01-24 NOTE — Telephone Encounter (Signed)
Pt is needing a refill on her Dimethyl Fumarate (TECFIDERA) 240 MG CPDR sent in to [facility]

## 2020-01-24 NOTE — Telephone Encounter (Signed)
Script sent in

## 2020-02-08 ENCOUNTER — Telehealth: Payer: Self-pay | Admitting: Neurology

## 2020-02-08 NOTE — Telephone Encounter (Signed)
Pt called stating that she is needing her Dimethyl Fumarate (TECFIDERA) 240 MG CPDR called in as the brand name to the [facility] mail order because she has now been out for a month. Please advise.

## 2020-02-11 MED ORDER — TECFIDERA 240 MG PO CPDR: 240.0000 mg | DELAYED_RELEASE_CAPSULE | Freq: Two times a day (BID) | ORAL | 3 refills | Status: DC

## 2020-02-11 NOTE — Telephone Encounter (Signed)
Yes, let's please do what we can to get her back on this. She has been off, will likely need to do the starter pack to get back on.

## 2020-02-11 NOTE — Addendum Note (Signed)
Addended by: Guy Begin on: 02/11/2020 04:17 PM   Modules accepted: Orders

## 2020-02-11 NOTE — Telephone Encounter (Signed)
Pt has called because [facility] is telling her that our office needs to contact them re: her going from the generic back to brand name Dimethyl Fumarate (TECFIDERA) 240 MG CPDR .  Pt states  [facility] can be called at [phone]

## 2020-02-11 NOTE — Telephone Encounter (Signed)
I called  [facility]. Spoke to victoria, she stated that the generic will be $680.00 / copay.  (with co pay assistance TEVA -$200 so will be $480/month.  She cannot afford.  She has been off foe 2 months now.  I will place Brand name tecfidera order and will have to go thru process of PA and then most likely appeal.  Pt has been on since 2014, minimal SE, this is her only therapy.  She is not interested in other options if she can all but help it.

## 2020-02-11 NOTE — Telephone Encounter (Signed)
Initiated CMM KEY [id number] For tecfidera starter kit.  Determination pending.  

## 2020-02-19 NOTE — Telephone Encounter (Signed)
I spoke to pt and I explained have received PA denial, awaiting appeal denial for her tecfidera.  She has been out to meds for 2 months now.  She is doing ok at this time.

## 2020-02-19 NOTE — Telephone Encounter (Signed)
Pt is asking for a call from RN to discuss where things stand re: her getting back on tecfidera .  Please call

## 2020-02-20 NOTE — Telephone Encounter (Signed)
I called the patient. [name] is working to appeal and see if we can get brand-name Tecfidera for the patient.  I am concerned with her being off medication.  I mentioned to her the possibility of switching to Vumerity, she is not sure about this.  She will do some research.  She really wants to stay on Tecfidera, she has been on it since 2014, and is really hesitant to switch to anything else. We don't know yet if [employer] will make exception for her stay on.  She cannot afford generic, is around 400 hours a month.

## 2020-02-25 NOTE — Telephone Encounter (Signed)
I called [employer] spoke to [place] D in Provider line, she stated I would need CS, transferred and spoke to angel in CS she was checking on this for me and then after being placed on hold, (total time approx ) survey came on, and then hung up, (no resolution given). Lawanna Kobus did have my call back #.  Will attempt later.

## 2020-02-26 NOTE — Telephone Encounter (Signed)
I called pt and relayed that still awaitng appeal on tecfidera.  I asked her about vumerity, she had not read up on it yet.  I relayed that speaking to [employer] the tecfidera free drug program would be ending 05-26-20 this year then pts would be responsible for the copay at that time.  I could work on vumerity enrollment at the same time as awaiting for appeal.  She will let me know via email or phone.

## 2020-02-26 NOTE — Telephone Encounter (Signed)
If she wants to stay on Tecfidera, I think we should consider Vumerity, since so similar. The paperwork could take a few weeks, would be best if we could go ahead and get started.

## 2020-02-28 NOTE — Telephone Encounter (Signed)
I called CS [employer] inquiring for pt on appeal for [facility] Appeal request # [id number] per Gardiner Rhyme, REF # [id number].  Gardiner Rhyme stated that the decision will be posted 03-13-20.

## 2020-03-14 NOTE — Telephone Encounter (Signed)
I called pt and relayed have not heard of appeal decision, due 03-13-20.  I relayed that we would procedd with Vumerity (brand name drug) if ok with her.  She was ok to do.  Completed form and fax received (confirmation) 03-14-20.

## 2020-03-19 ENCOUNTER — Encounter: Payer: Self-pay | Admitting: Neurology

## 2020-03-19 NOTE — Telephone Encounter (Signed)
I received call form william at [facility] verification Aide.  Vumerity not a covered med.  Need formulary exception PA done or choose on insurance formaulary list.  Will check back next Monday.

## 2020-03-19 NOTE — Telephone Encounter (Signed)
I called [employer] Pharmacy Intake, PA dept. Spoke to [place], This appeal sent to provider courtesy review and denial was upheld. This on 02-16-20, said was faxed to [place] (I did not receive).  I have spoken to [facility] CS multiple times since that 02-16-20 and they did not relay this to me, last was stating that determination would be posted 03-13-20.  I did receive this denial yesterday.

## 2020-03-20 ENCOUNTER — Encounter: Payer: Self-pay | Admitting: Neurology

## 2020-03-21 ENCOUNTER — Telehealth: Payer: Self-pay | Admitting: Neurology

## 2020-03-21 NOTE — Telephone Encounter (Signed)
Joy from [place] called stating that the appeal for Vumerity has been denied due to it not being medically necessary.

## 2020-03-24 ENCOUNTER — Encounter: Payer: Self-pay | Admitting: Neurology

## 2020-03-24 NOTE — Telephone Encounter (Signed)
I called [employer], spoke to [employer].  She gave me fax [phone] to fax , and fax confirmation received.

## 2020-03-24 NOTE — Telephone Encounter (Signed)
Received documentation for denial of appeal. Will forward PA denial and APPeal denial to Vumerity free drug.

## 2020-04-07 ENCOUNTER — Other Ambulatory Visit: Payer: Self-pay

## 2020-04-07 ENCOUNTER — Encounter: Payer: Self-pay | Admitting: Neurology

## 2020-04-07 ENCOUNTER — Ambulatory Visit: Payer: BC Managed Care – PPO | Admitting: Neurology

## 2020-04-07 ENCOUNTER — Telehealth: Payer: Self-pay

## 2020-04-07 VITALS — BP 126/70 | HR 84 | Ht 64.0 in | Wt 202.0 lb

## 2020-04-07 DIAGNOSIS — G35 Multiple sclerosis: Secondary | ICD-10-CM

## 2020-04-07 DIAGNOSIS — Z5181 Encounter for therapeutic drug level monitoring: Secondary | ICD-10-CM

## 2020-04-07 NOTE — Telephone Encounter (Signed)
  I called [employer] today at [phone]. I stated pt was seen today for MS follow up and has not receive her vulmerity. They stated it was a delay on 04/03/2020 and and it has been ship.They stated the medication should be at pts house today 04/07/2020. They track it via [employer]. I stated Dr. Anne Hahn will be notified.

## 2020-04-07 NOTE — Progress Notes (Signed)
Reason for visit: Multiple sclerosis, relapsing remitting  [name] is an 54 y.o. female  History of present illness:  Ms. Antone is a 54 year old left-handed black female with a history of multiple sclerosis.  The patient has been on Tecfidera, but when the medication converted to the generic, she was unable to get brand-name medication free of charge, and she could not afford the co-pay for the generic medication.  The patient is to be switched to Vumerity, the note from the end of March 2021 indicates that the medication was shipped but the patient has never received anything.  The patient is not on any disease modifying agents currently, she has not been on any medication for about 4 months.  In December 2020, she had an event lasting a week with twitching around the corner of the mouth on the left.  MRI of the brain was done and did not show any new MS lesions, the patient has a C3 spinal cord lesion and minimal involvement of the brain.  The patient has not had any new numbness or weakness of the face, arms, legs.  She denies bladder control problems or difficulty with balance.  She has had no changes in vision.  Past Medical History:  Diagnosis Date  . Multiple sclerosis ([place])     Past Surgical History:  Procedure Laterality Date  . CESAREAN SECTION      Family History  Problem Relation Age of Onset  . Hypertension Mother   . Post-traumatic stress disorder Father   . Hypertension Father   . Stroke Father   . Hypertension Brother   . Hypertension Brother     Social history:  reports that she has never smoked. She has never used smokeless tobacco. She reports that she does not drink alcohol or use drugs.    Allergies  Allergen Reactions  . Erythromycin Shortness Of Breath    Chest pains also    Medications:  Prior to Admission medications   Medication Sig Start Date End Date Taking? Authorizing Provider  ibuprofen (ADVIL,MOTRIN) 200 MG tablet Take 400 mg by  mouth every 6 (six) hours as needed for headache.   Yes [provider]  TECFIDERA 240 MG CPDR Take 1 capsule (240 mg total) by mouth 2 (two) times daily. 02/11/20  Yes Suzzanne Cloud, NP    ROS:  Out of a complete 14 system review of symptoms, the patient complains only of the following symptoms, and all other reviewed systems are negative.  Facial twitching  Blood pressure 126/70, pulse 84, height 5\' 4"  (1.626 m), weight 202 lb (91.6 kg).  Physical Exam  General: The patient is alert and cooperative at the time of the examination.  Skin: No significant peripheral edema is noted.   Neurologic Exam  Mental status: The patient is alert and oriented x 3 at the time of the examination. The patient has apparent normal recent and remote memory, with an apparently normal attention span and concentration ability.   Cranial nerves: Facial symmetry is present. Speech is normal, no aphasia or dysarthria is noted. Extraocular movements are full. Visual fields are full.  Pupils are equal, round, and reactive to light.  Discs are flat bilaterally.  Motor: The patient has good strength in all 4 extremities.  Sensory examination: Soft touch sensation is symmetric on the face, arms, and legs.  Coordination: The patient has good finger-nose-finger and heel-to-shin bilaterally.  Gait and station: The patient has a normal gait. Tandem gait is  normal. Romberg is negative. No drift is seen.  Reflexes: Deep tendon reflexes are symmetric.  MRI brain 12/19/19:  IMPRESSION:   MRI brain (with and without) demonstrating: - Stable left thalamic and minimal peri-callosal chronic demyelinating plaques. No abnormal lesions on post-contrast views.  - No change from MRI on 09/19/18.   * MRI scan images were reviewed online. I agree with the written report.    Assessment/Plan:  1.  Multiple sclerosis, relapsing remitting  The patient is to be switched over to Vumerity, we will need to  follow-up with why she has not received the medication yet.  Blood work will be done today, she will follow-up here in 6 months.  Overall clinically she is doing well.   Greater than 50% of the visit was spent in counseling and coordination of care.  Face-to-face time with the patient was 25 minutes.   Marlan Palau MD 04/07/2020 10:14 AM  [facility] [address] [place], [place] [address]  Phone [phone] Fax [phone]

## 2020-04-07 NOTE — Telephone Encounter (Signed)
Events noted

## 2020-04-08 LAB — CBC WITH DIFFERENTIAL/PLATELET
Basophils Absolute: 0 10*3/uL (ref 0.0–0.2)
Basos: 1 %
EOS (ABSOLUTE): 0.1 10*3/uL (ref 0.0–0.4)
Eos: 2 %
Hematocrit: 37.1 % (ref 34.0–46.6)
Hemoglobin: 12.1 g/dL (ref 11.1–15.9)
Immature Grans (Abs): 0 10*3/uL (ref 0.0–0.1)
Immature Granulocytes: 0 %
Lymphocytes Absolute: 1.8 10*3/uL (ref 0.7–3.1)
Lymphs: 28 %
MCH: 29.2 pg (ref 26.6–33.0)
MCHC: 32.6 g/dL (ref 31.5–35.7)
MCV: 89 fL (ref 79–97)
Monocytes Absolute: 0.5 10*3/uL (ref 0.1–0.9)
Monocytes: 8 %
Neutrophils Absolute: 3.9 10*3/uL (ref 1.4–7.0)
Neutrophils: 61 %
Platelets: 341 10*3/uL (ref 150–450)
RBC: 4.15 x10E6/uL (ref 3.77–5.28)
RDW: 12.8 % (ref 11.7–15.4)
WBC: 6.3 10*3/uL (ref 3.4–10.8)

## 2020-04-08 LAB — COMPREHENSIVE METABOLIC PANEL
ALT: 16 IU/L (ref 0–32)
AST: 17 IU/L (ref 0–40)
Albumin/Globulin Ratio: 1.7 (ref 1.2–2.2)
Albumin: 4.3 g/dL (ref 3.8–4.9)
Alkaline Phosphatase: 78 IU/L (ref 39–117)
BUN/Creatinine Ratio: 11 (ref 9–23)
BUN: 10 mg/dL (ref 6–24)
Bilirubin Total: 0.3 mg/dL (ref 0.0–1.2)
CO2: 27 mmol/L (ref 20–29)
Calcium: 9 mg/dL (ref 8.7–10.2)
Chloride: 105 mmol/L (ref 96–106)
Creatinine, Ser: 0.93 mg/dL (ref 0.57–1.00)
GFR calc Af Amer: 82 mL/min/{1.73_m2} (ref >59)
GFR calc non Af Amer: 71 mL/min/{1.73_m2} (ref >59)
Globulin, Total: 2.5 g/dL (ref 1.5–4.5)
Glucose: 103 mg/dL — ABNORMAL HIGH (ref 65–99)
Potassium: 4.1 mmol/L (ref 3.5–5.2)
Sodium: 142 mmol/L (ref 134–144)
Total Protein: 6.8 g/dL (ref 6.0–8.5)

## 2020-04-14 ENCOUNTER — Other Ambulatory Visit: Payer: Self-pay | Admitting: *Deleted

## 2020-04-14 DIAGNOSIS — Z1231 Encounter for screening mammogram for malignant neoplasm of breast: Secondary | ICD-10-CM

## 2020-04-16 ENCOUNTER — Ambulatory Visit
Admission: RE | Admit: 2020-04-16 | Discharge: 2020-04-16 | Disposition: A | Discharge status: Home / Self Care | Payer: BLUE CROSS/BLUE SHIELD | Source: Ambulatory Visit | Attending: *Deleted | Admitting: *Deleted

## 2020-04-16 ENCOUNTER — Other Ambulatory Visit: Payer: Self-pay

## 2020-04-16 DIAGNOSIS — Z1231 Encounter for screening mammogram for malignant neoplasm of breast: Secondary | ICD-10-CM

## 2020-04-22 ENCOUNTER — Encounter: Payer: Self-pay | Admitting: Neurology

## 2020-10-07 ENCOUNTER — Ambulatory Visit: Payer: BC Managed Care – PPO | Admitting: Neurology

## 2020-10-07 ENCOUNTER — Encounter: Payer: Self-pay | Admitting: Neurology

## 2020-10-07 VITALS — BP 121/74 | HR 96 | Ht 64.0 in | Wt 197.4 lb

## 2020-10-07 DIAGNOSIS — G35 Multiple sclerosis: Secondary | ICD-10-CM | POA: Diagnosis not present

## 2020-10-07 NOTE — Progress Notes (Signed)
I have read the note, and I agree with the clinical assessment and plan.  Panzy Bubeck K Amiracle Neises   

## 2020-10-07 NOTE — Patient Instructions (Signed)
Review the information on Vumerity  Review the MS society website about medications I recommend being on a medication for MS Let me know what you decide See you back in 6 months

## 2020-10-07 NOTE — Progress Notes (Signed)
PATIENT: [name] DOB: [birth year]  REASON FOR VISIT: follow up HISTORY FROM: patient  HISTORY OF PRESENT ILLNESS: Today 10/07/20 [name] is a 54 year old female with history of multiple sclerosis.  She was on Tecfidera, could no longer afford when it went generic. In December 2020, had twitching around the corner of the mouth on the left, MRI of the brain did not show any new MS lesions, has C3 spinal cord lesion, minimal involvement of the brain (was on Tecfidera).  She was switched to Vumerity, but never started medication.  She reportedly never wanted to be on Tecfidera, but went along with it.  Is afraid to start Vumerity, worried about side effects of being on any MS medication, specifically immunosuppression.  Had Covid about 3 weeks ago.  Denies any MS symptoms, occasionally if she sleeps on her back, will have tingling in her fingers, but goes away when shaking them out.  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  Is controlling the MS with vitamins, change the way she eats.  Presents today for evaluation unaccompanied.  HISTORY  04/07/2020 Dr. Anne Hahn: [name] is a 54 year old left-handed black female with a history of multiple sclerosis.  The patient has been on Tecfidera, but when the medication converted to the generic, she was unable to get brand-name medication free of charge, and she could not afford the co-pay for the generic medication.  The patient is to be switched to Vumerity, the note from the end of March 2021 indicates that the medication was shipped but the patient has never received anything.  The patient is not on any disease modifying agents currently, she has not been on any medication for about 4 months.  In December 2020, she had an event lasting a week with twitching around the corner of the mouth on the left.  MRI of the brain was done and did not show any new MS lesions, the patient has a C3 spinal cord lesion and minimal involvement of the brain.  The patient has not  had any new numbness or weakness of the face, arms, legs.  She denies bladder control problems or difficulty with balance.  She has had no changes in vision.  REVIEW OF SYSTEMS: Out of a complete 14 system review of symptoms, the patient complains only of the following symptoms, and all other reviewed systems are negative.  N/A  ALLERGIES: Allergies  Allergen Reactions   Erythromycin Shortness Of Breath    Chest pains also    HOME MEDICATIONS: Outpatient Medications Prior to Visit  Medication Sig Dispense Refill   ibuprofen (ADVIL,MOTRIN) 200 MG tablet Take 400 mg by mouth every 6 (six) hours as needed for headache.     TECFIDERA 240 MG CPDR Take 1 capsule (240 mg total) by mouth 2 (two) times daily. 180 capsule 3   No facility-administered medications prior to visit.    PAST MEDICAL HISTORY: Past Medical History:  Diagnosis Date   Multiple sclerosis (HCC)     PAST SURGICAL HISTORY: Past Surgical History:  Procedure Laterality Date   CESAREAN SECTION      FAMILY HISTORY: Family History  Problem Relation Age of Onset   Hypertension Mother    Post-traumatic stress disorder Father    Hypertension Father    Stroke Father    Hypertension Brother    Hypertension Brother     SOCIAL HISTORY: Social History   Socioeconomic History   Marital status: Married    Spouse name: Primitivo Gauze   Number  of children: 4   Years of education: 13   Highest education level: Not on file  Occupational History   Not on file  Tobacco Use   Smoking status: Never Smoker   Smokeless tobacco: Never Used  Substance and Sexual Activity   Alcohol use: No   Drug use: No   Sexual activity: Not on file  Other Topics Concern   Not on file  Social History Narrative   Lives with husband   Caffeine use: Sometimes tea, soda sometimes    Left-handed   Social Determinants of Health   Financial Resource Strain:    Difficulty of Paying Living Expenses: Not on file  Food  Insecurity:    Worried About Running Out of Food in the Last Year: Not on file   Ran Out of Food in the Last Year: Not on file  Transportation Needs:    Lack of Transportation (Medical): Not on file   Lack of Transportation (Non-Medical): Not on file  Physical Activity:    Days of Exercise per Week: Not on file   Minutes of Exercise per Session: Not on file  Stress:    Feeling of Stress : Not on file  Social Connections:    Frequency of Communication with Friends and Family: Not on file   Frequency of Social Gatherings with Friends and Family: Not on file   Attends Religious Services: Not on file   Active Member of Clubs or Organizations: Not on file   Attends Banker Meetings: Not on file   Marital Status: Not on file  Intimate Partner Violence:    Fear of Current or Ex-Partner: Not on file   Emotionally Abused: Not on file   Physically Abused: Not on file   Sexually Abused: Not on file   PHYSICAL EXAM  Vitals:   10/07/20 1023  BP: 121/74  Pulse: 96  Weight: 197 lb 6.4 oz (89.5 kg)  Height: 5\' 4"  (1.626 m)   Body mass index is 33.88 kg/m.  Generalized: Well developed, in no acute distress  Neurological examination  Mentation: Alert oriented to time, place, history taking. Follows all commands speech and language fluent Cranial nerve II-XII: Pupils were equal round reactive to light. Extraocular movements were full, visual field were full on confrontational test. Facial sensation and strength were normal. Head turning and shoulder shrug were normal and symmetric. Motor: The motor testing reveals 5 over 5 strength of all 4 extremities. Good symmetric motor tone is noted throughout.  Sensory: Sensory testing is intact to soft touch on all 4 extremities. No evidence of extinction is noted.  Coordination: Cerebellar testing reveals good finger-nose-finger and heel-to-shin bilaterally.  Gait and station: Gait is normal. Tandem gait is normal.  Romberg is negative. No drift is seen.  Reflexes: Deep tendon reflexes are symmetric and normal bilaterally.   DIAGNOSTIC DATA (LABS, IMAGING, TESTING) - I reviewed patient records, labs, notes, testing and imaging myself where available.  Lab Results  Component Value Date   WBC 6.3 04/07/2020   HGB 12.1 04/07/2020   HCT 37.1 04/07/2020   MCV 89 04/07/2020   PLT 341 04/07/2020      Component Value Date/Time   NA 142 04/07/2020 1040   K 4.1 04/07/2020 1040   CL 105 04/07/2020 1040   CO2 27 04/07/2020 1040   GLUCOSE 103 (H) 04/07/2020 1040   GLUCOSE 108 (H) 02/28/2017 1618   BUN 10 04/07/2020 1040   CREATININE 0.93 04/07/2020 1040   CALCIUM 9.0 04/07/2020  1040   PROT 6.8 04/07/2020 1040   ALBUMIN 4.3 04/07/2020 1040   AST 17 04/07/2020 1040   ALT 16 04/07/2020 1040   ALKPHOS 78 04/07/2020 1040   BILITOT 0.3 04/07/2020 1040   GFRNONAA 71 04/07/2020 1040   GFRAA 82 04/07/2020 1040   Lab Results  Component Value Date   CHOL  04/21/2011    176        ATP III CLASSIFICATION:  <200     mg/dL   Desirable  440-347  mg/dL   Borderline High  >=425    mg/dL   High          HDL 44 04/21/2011   LDLCALC (H) 04/21/2011    115        Total Cholesterol/HDL:CHD Risk Coronary Heart Disease Risk Table                     Men   Women  1/2 Average Risk   3.4   3.3  Average Risk       5.0   4.4  2 X Average Risk   9.6   7.1  3 X Average Risk  23.4   11.0        Use the calculated Patient Ratio above and the CHD Risk Table to determine the patient's CHD Risk.        ATP III CLASSIFICATION (LDL):  <100     mg/dL   Optimal  956-387  mg/dL   Near or Above                    Optimal  130-159  mg/dL   Borderline  564-332  mg/dL   High  >951     mg/dL   Very High   TRIG 87 88/41/6606   CHOLHDL 4.0 04/21/2011   Lab Results  Component Value Date   HGBA1C (H) 04/20/2011    5.9 (NOTE)                                                                       According to the ADA  Clinical Practice Recommendations for 2011, when HbA1c is used as a screening test:   >=6.5%   Diagnostic of Diabetes Mellitus           (if abnormal result  is confirmed)  5.7-6.4%   Increased risk of developing Diabetes Mellitus  References:Diagnosis and Classification of Diabetes Mellitus,Diabetes Care,2011,34(Suppl 1):S62-S69 and Standards of Medical Care in         Diabetes - 2011,Diabetes Care,2011,34  (Suppl 1):S11-S61.   Lab Results  Component Value Date   VITAMINB12 943 (H) 04/21/2011   Lab Results  Component Value Date   TSH 0.684 04/21/2011    ASSESSMENT AND PLAN 54 y.o. year old female  has a past medical history of Multiple sclerosis (HCC). here with:  1.  Relapsing remitting multiple sclerosis  She is not interested in starting MS medications, she was on Tecfidera, but could no longer afford the co-pay when it went generic.  We switched her to Vumerity, she never started the medication.  She does not want to be on any MS medications at this time.  We talked about the significant risk  of this, she is aware.  I do NOT recommend remaining off DMT for MS. She has a known C3 lesion.  I have given her a pamphlet on Vumerity, we had an extensive conversation about the risks she is facing, she is still willing to proceed.  I encouraged her to do her own research via MS Society, feel free to reach out with any questions, or if she changes her mind.  I will see her back in 6 months or sooner if needed.  I spent 30 minutes of face-to-face and non-face-to-face time with patient.  This included previsit chart review, lab review, study review, order entry, electronic health record documentation, patient education.  Margie Ege, AGNP-C, DNP 10/07/2020, 3:11 PM [facility] [address], [address] [place], [place] [address] [phone]

## 2020-10-08 IMAGING — MG DIGITAL SCREENING BILAT W/ CAD
5 series · 5 of 5 positions shown · non-contrast
Comparison: Previous exam(s).

CLINICAL DATA: Screening.

EXAM:
DIGITAL SCREENING BILATERAL MAMMOGRAM WITH CAD

[R MLO]
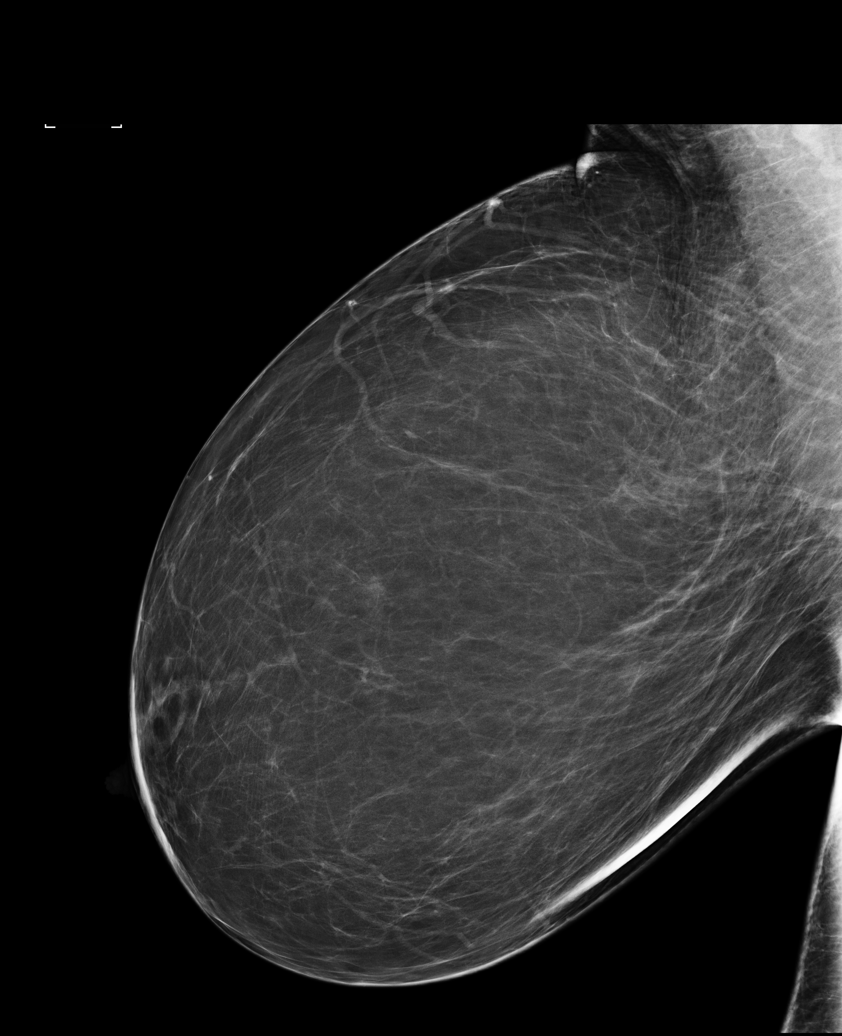

[L MLO (1 of 2)]
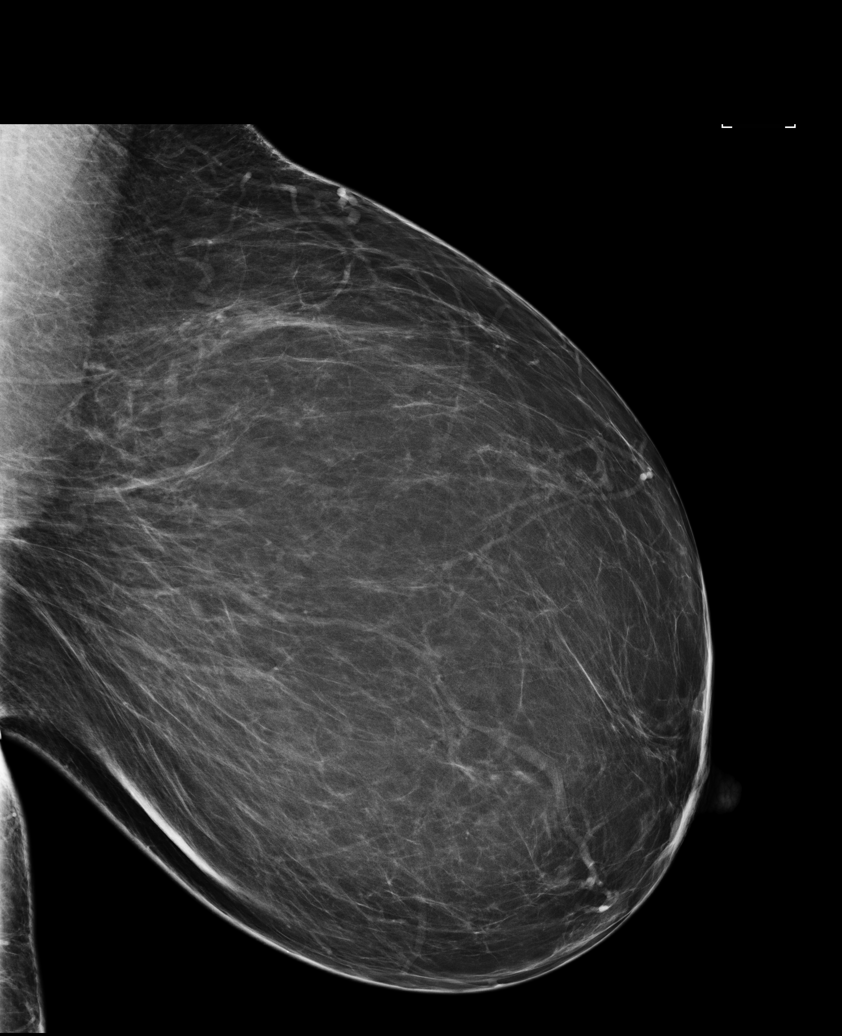

[R CC]
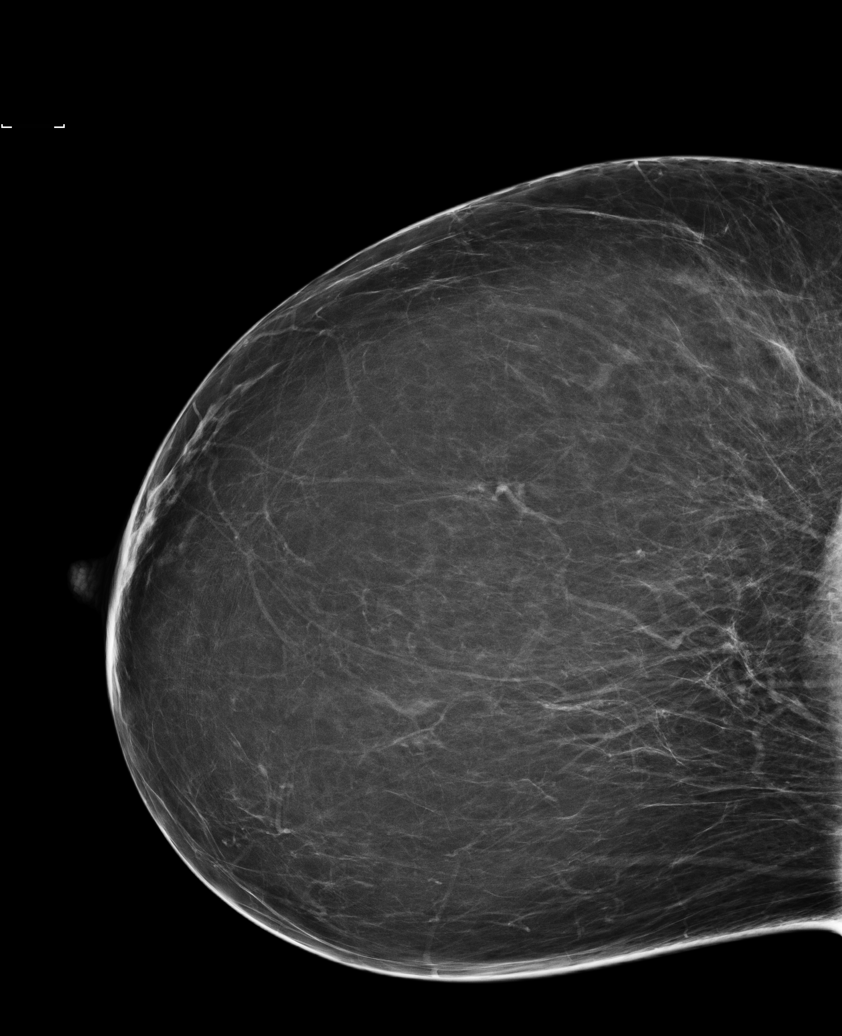

[L CC]
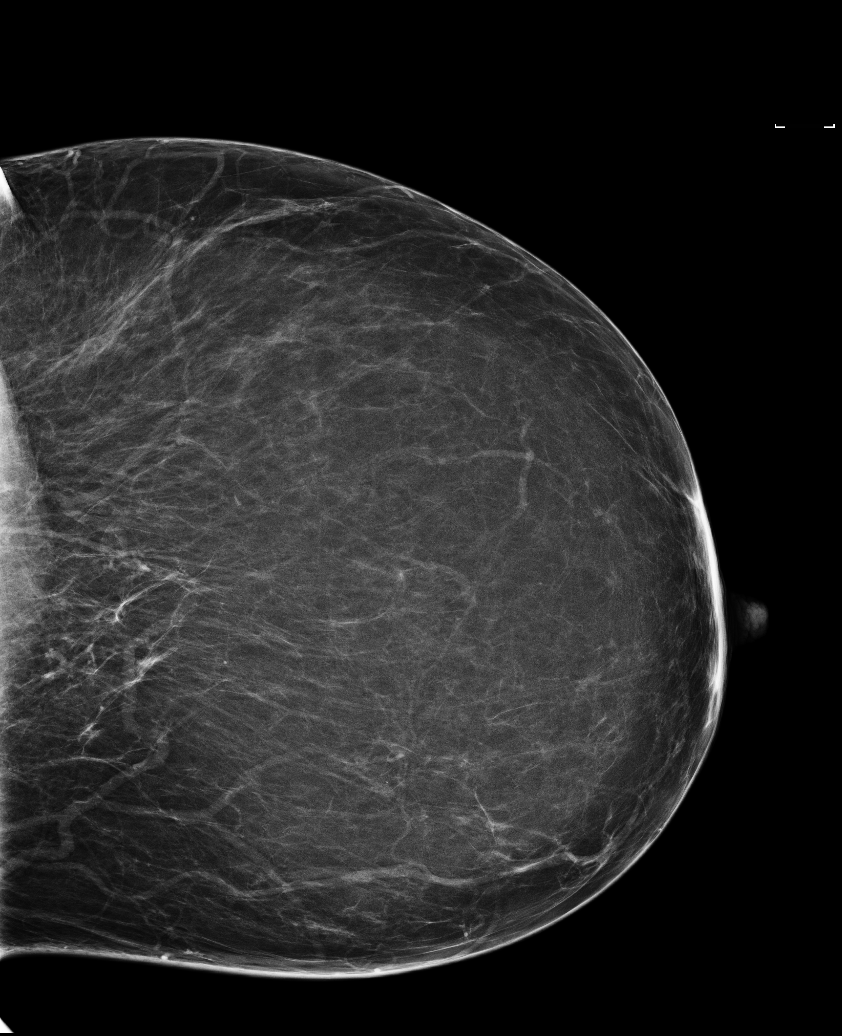

[L MLO (2 of 2)]
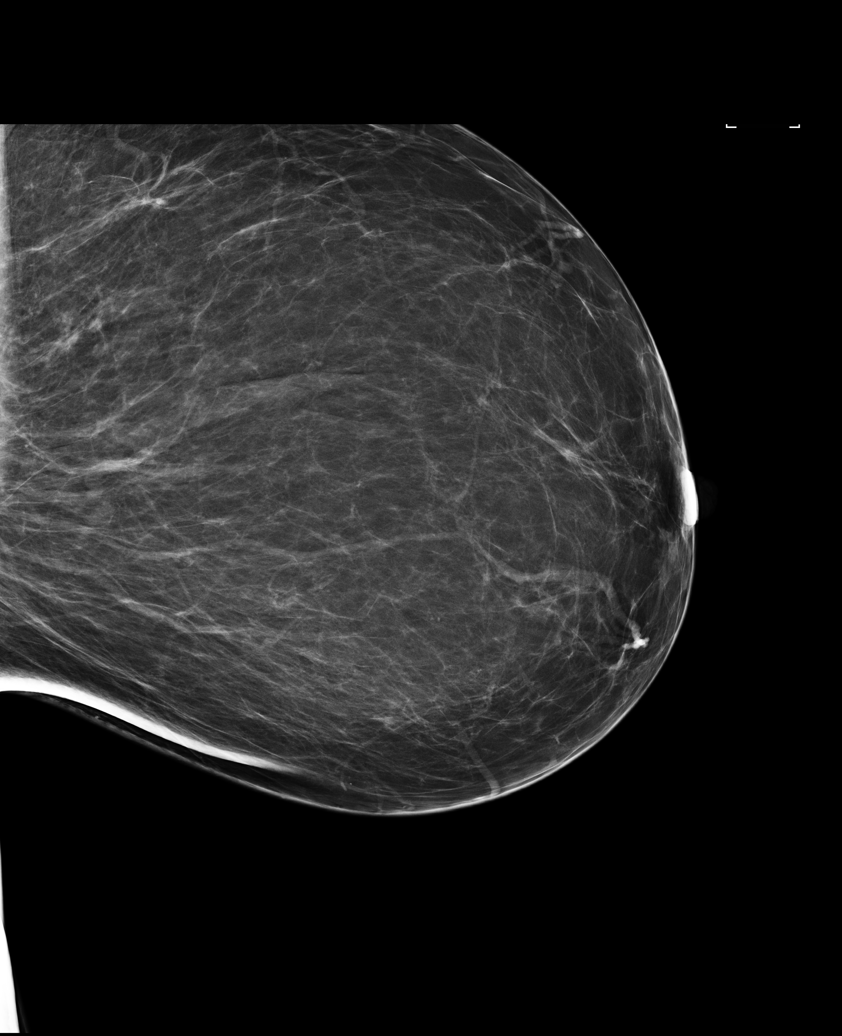

[5 of 5 positions shown; findings below may reference images not displayed]

ACR Breast Density Category b: There are scattered areas of
fibroglandular density.
FINDINGS: There are no findings suspicious for malignancy. Images were
processed with CAD.
IMPRESSION: No mammographic evidence of malignancy. A result letter of this
screening mammogram will be mailed directly to the patient.

RECOMMENDATION:
Screening mammogram in one year. (Code:AS-G-LCT)

BI-RADS CATEGORY  1: Negative.

## 2020-11-13 ENCOUNTER — Other Ambulatory Visit: Payer: Self-pay | Admitting: *Deleted

## 2020-11-13 DIAGNOSIS — N6320 Unspecified lump in the left breast, unspecified quadrant: Secondary | ICD-10-CM

## 2020-12-04 ENCOUNTER — Other Ambulatory Visit: Payer: Self-pay | Admitting: *Deleted

## 2020-12-04 DIAGNOSIS — N6320 Unspecified lump in the left breast, unspecified quadrant: Secondary | ICD-10-CM

## 2021-01-09 ENCOUNTER — Ambulatory Visit
Admission: RE | Admit: 2021-01-09 | Discharge: 2021-01-09 | Disposition: A | Discharge status: Home / Self Care | Payer: Self-pay | Source: Ambulatory Visit | Attending: *Deleted | Admitting: *Deleted

## 2021-01-09 ENCOUNTER — Other Ambulatory Visit: Payer: Self-pay

## 2021-01-09 ENCOUNTER — Ambulatory Visit: Payer: BC Managed Care – PPO

## 2021-01-09 DIAGNOSIS — N6320 Unspecified lump in the left breast, unspecified quadrant: Secondary | ICD-10-CM

## 2021-03-23 ENCOUNTER — Telehealth: Payer: Self-pay | Admitting: *Deleted

## 2021-03-23 NOTE — Telephone Encounter (Signed)
Received fax 03-19-21 from [employer] that [employer] that pt is currently enrolled in this program. [employer] will conduct investigation of pts coverage. If any needed PA needed they will be in touch with [place] to complete. (have 45 days to complete) if over that [employer] reserves the right to alter terms of the program at any time.[phone].

## 2021-04-08 ENCOUNTER — Ambulatory Visit: Payer: BLUE CROSS/BLUE SHIELD | Admitting: Neurology

## 2021-06-21 ENCOUNTER — Emergency Department (HOSPITAL_COMMUNITY)
Admission: EM | Admit: 2021-06-21 | Discharge: 2021-06-21 | Disposition: A | Discharge status: Home / Self Care | Payer: BLUE CROSS/BLUE SHIELD | Attending: Emergency Medicine | Admitting: Emergency Medicine

## 2021-06-21 ENCOUNTER — Emergency Department (HOSPITAL_COMMUNITY): Payer: BLUE CROSS/BLUE SHIELD

## 2021-06-21 ENCOUNTER — Other Ambulatory Visit: Payer: Self-pay

## 2021-06-21 ENCOUNTER — Encounter (HOSPITAL_COMMUNITY): Payer: Self-pay | Admitting: Emergency Medicine

## 2021-06-21 DIAGNOSIS — S0101XA Laceration without foreign body of scalp, initial encounter: Secondary | ICD-10-CM | POA: Insufficient documentation

## 2021-06-21 DIAGNOSIS — S50812A Abrasion of left forearm, initial encounter: Secondary | ICD-10-CM | POA: Insufficient documentation

## 2021-06-21 DIAGNOSIS — S63502A Unspecified sprain of left wrist, initial encounter: Secondary | ICD-10-CM | POA: Insufficient documentation

## 2021-06-21 DIAGNOSIS — S0181XA Laceration without foreign body of other part of head, initial encounter: Secondary | ICD-10-CM | POA: Insufficient documentation

## 2021-06-21 DIAGNOSIS — Z23 Encounter for immunization: Secondary | ICD-10-CM | POA: Insufficient documentation

## 2021-06-21 MED ORDER — MORPHINE SULFATE (PF) 4 MG/ML IV SOLN
4.0000 mg | Freq: Once | INTRAVENOUS | Status: AC
Start: 2021-06-21 — End: 2021-06-21
  Administered 2021-06-21: 4 mg via INTRAVENOUS
  Filled 2021-06-21: qty 1

## 2021-06-21 MED ORDER — OXYCODONE HCL 5 MG PO TABS: 5.0000 mg | ORAL_TABLET | Freq: Four times a day (QID) | ORAL | 0 refills | Status: AC | PRN

## 2021-06-21 MED ORDER — TETANUS-DIPHTH-ACELL PERTUSSIS 5-2.5-18.5 LF-MCG/0.5 IM SUSY
0.5000 mL | PREFILLED_SYRINGE | Freq: Once | INTRAMUSCULAR | Status: AC
  Administered 2021-06-21: 0.5 mL via INTRAMUSCULAR
  Filled 2021-06-21: qty 0.5

## 2021-06-21 NOTE — ED Notes (Signed)
Patient transported to CT 
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
## 2021-06-21 NOTE — ED Notes (Addendum)
pts wound cleaned with peroxide and gauze per PA. Pt resting with husband at bedside. Dermabond and stapler at bedside.

## 2021-06-21 NOTE — ED Notes (Signed)
CSI at bedside.

## 2021-06-21 NOTE — ED Notes (Signed)
Pt returned from imaging.

## 2021-06-21 NOTE — ED Notes (Signed)
Pt verbalizes understanding of discharge instructions. Opportunity for questions and answers were provided. Pt discharged from the ED.   ?

## 2021-06-21 NOTE — Discharge Instructions (Addendum)
The adhesive on your face should peel off on its own within the next week  You have 2 staples to your scalp which will need to be removed in 1 week  You have an abrasion to your left elbow.  May place some Neosporin or bacitracin over this and keep clean  Your CT scans and x-rays do not show any evidence of acute broken bones or any abnormality to your head or neck.  I have placed you in a wrist brace on your left hand.  If you are still having pain after we have an week follow-up with orthopedics.  You may take Tylenol and ibuprofen as needed for pain  I have written you for a short course of some pain medicine if the Tylenol and ibuprofen does not help.  Do not drive or operate heavy machinery while taking this medication.  This may make you sleepy.  Please use caution as this medication is an opiate medication and may become addictive  I have written you a note for work.  You may return sooner if your symptoms have improved  Return for new or worsening symptoms

## 2021-06-30 ENCOUNTER — Encounter (HOSPITAL_COMMUNITY): Payer: Self-pay

## 2021-06-30 ENCOUNTER — Ambulatory Visit (HOSPITAL_COMMUNITY)
Admission: EM | Admit: 2021-06-30 | Discharge: 2021-06-30 | Disposition: A | Discharge status: Home / Self Care | Payer: BLUE CROSS/BLUE SHIELD | Attending: Internal Medicine | Admitting: Internal Medicine

## 2021-06-30 ENCOUNTER — Other Ambulatory Visit: Payer: Self-pay

## 2021-06-30 DIAGNOSIS — Z4802 Encounter for removal of sutures: Secondary | ICD-10-CM

## 2021-06-30 NOTE — ED Notes (Signed)
2 Staples removed with no complications, skin dry, clean, and intact.

## 2021-06-30 NOTE — Discharge Instructions (Signed)
Make sure to keep site clean, and dry. Try not to pull or scratch scalp where staples were removed.

## 2021-06-30 NOTE — ED Triage Notes (Signed)
Pt is here for staple removal from laceration, site looks clean and dry.

## 2023-03-04 ENCOUNTER — Other Ambulatory Visit: Payer: Self-pay | Admitting: Internal Medicine

## 2023-03-04 DIAGNOSIS — R519 Headache, unspecified: Secondary | ICD-10-CM

## 2023-03-29 ENCOUNTER — Ambulatory Visit
Admission: RE | Admit: 2023-03-29 | Discharge: 2023-03-29 | Disposition: A | Discharge status: Home / Self Care | Payer: Medicaid Other | Source: Ambulatory Visit | Attending: Internal Medicine | Admitting: Internal Medicine

## 2023-03-29 DIAGNOSIS — R519 Headache, unspecified: Secondary | ICD-10-CM

## 2023-05-12 ENCOUNTER — Other Ambulatory Visit: Payer: Self-pay | Admitting: Obstetrics and Gynecology
